# Patient Record
Sex: Male | Born: 1960 | Race: Black or African American | Hispanic: No | Smoking: Current every day smoker
Health system: Southern US, Community
[De-identification: ages and names within clinical notes are randomized; demographics above are authoritative.]

## PROBLEM LIST (undated history)

## (undated) ENCOUNTER — Emergency Department (HOSPITAL_COMMUNITY): Admission: EM | Payer: Self-pay | Source: Home / Self Care

## (undated) DIAGNOSIS — L089 Local infection of the skin and subcutaneous tissue, unspecified: Secondary | ICD-10-CM

## (undated) DIAGNOSIS — W3400XA Accidental discharge from unspecified firearms or gun, initial encounter: Secondary | ICD-10-CM

## (undated) DIAGNOSIS — M1990 Unspecified osteoarthritis, unspecified site: Secondary | ICD-10-CM

## (undated) DIAGNOSIS — Y249XXA Unspecified firearm discharge, undetermined intent, initial encounter: Secondary | ICD-10-CM

## (undated) DIAGNOSIS — M25519 Pain in unspecified shoulder: Secondary | ICD-10-CM

## (undated) DIAGNOSIS — I10 Essential (primary) hypertension: Secondary | ICD-10-CM

## (undated) HISTORY — DX: Essential (primary) hypertension: I10

## (undated) HISTORY — DX: Pain in unspecified shoulder: M25.519

## (undated) HISTORY — PX: COSMETIC SURGERY: SHX468

---

## 1997-10-30 ENCOUNTER — Emergency Department (HOSPITAL_COMMUNITY): Admission: EM | Admit: 1997-10-30 | Discharge: 1997-10-30 | Payer: Self-pay | Admitting: Emergency Medicine

## 1998-03-01 ENCOUNTER — Encounter: Payer: Self-pay | Admitting: Emergency Medicine

## 1998-03-01 ENCOUNTER — Emergency Department (HOSPITAL_COMMUNITY): Admission: EM | Admit: 1998-03-01 | Discharge: 1998-03-01 | Payer: Self-pay | Admitting: Emergency Medicine

## 1998-07-04 ENCOUNTER — Emergency Department (HOSPITAL_COMMUNITY): Admission: EM | Admit: 1998-07-04 | Discharge: 1998-07-04 | Payer: Self-pay | Admitting: Emergency Medicine

## 1998-07-05 ENCOUNTER — Encounter: Payer: Self-pay | Admitting: Emergency Medicine

## 1999-09-25 ENCOUNTER — Emergency Department (HOSPITAL_COMMUNITY): Admission: EM | Admit: 1999-09-25 | Discharge: 1999-09-25 | Payer: Self-pay | Admitting: Emergency Medicine

## 2003-03-12 ENCOUNTER — Emergency Department (HOSPITAL_COMMUNITY): Admission: EM | Admit: 2003-03-12 | Discharge: 2003-03-13 | Payer: Self-pay

## 2006-04-28 ENCOUNTER — Emergency Department (HOSPITAL_COMMUNITY): Admission: EM | Admit: 2006-04-28 | Discharge: 2006-04-28 | Payer: Self-pay | Admitting: Emergency Medicine

## 2006-10-02 ENCOUNTER — Emergency Department (HOSPITAL_COMMUNITY): Admission: EM | Admit: 2006-10-02 | Discharge: 2006-10-02 | Payer: Self-pay | Admitting: Emergency Medicine

## 2009-04-03 ENCOUNTER — Emergency Department (HOSPITAL_COMMUNITY): Admission: EM | Admit: 2009-04-03 | Discharge: 2009-04-03 | Payer: Self-pay | Admitting: Emergency Medicine

## 2010-03-23 ENCOUNTER — Emergency Department (HOSPITAL_COMMUNITY)
Admission: EM | Admit: 2010-03-23 | Discharge: 2010-03-23 | Payer: Self-pay | Source: Home / Self Care | Admitting: Emergency Medicine

## 2010-03-24 LAB — DIFFERENTIAL
Basophils Absolute: 0 10*3/uL (ref 0.0–0.1)
Basophils Relative: 0 % (ref 0–1)
Eosinophils Absolute: 0.2 10*3/uL (ref 0.0–0.7)
Eosinophils Relative: 1 % (ref 0–5)
Lymphocytes Relative: 16 % (ref 12–46)
Lymphs Abs: 2.4 10*3/uL (ref 0.7–4.0)
Monocytes Absolute: 1.2 10*3/uL — ABNORMAL HIGH (ref 0.1–1.0)
Monocytes Relative: 8 % (ref 3–12)
Neutro Abs: 10.9 10*3/uL — ABNORMAL HIGH (ref 1.7–7.7)
Neutrophils Relative %: 74 % (ref 43–77)

## 2010-03-24 LAB — POCT I-STAT, CHEM 8
BUN: 7 mg/dL (ref 6–23)
Calcium, Ion: 1.05 mmol/L — ABNORMAL LOW (ref 1.12–1.32)
Chloride: 105 mEq/L (ref 96–112)
Creatinine, Ser: 1 mg/dL (ref 0.4–1.5)
Glucose, Bld: 105 mg/dL — ABNORMAL HIGH (ref 70–99)
HCT: 43 % (ref 39.0–52.0)
Hemoglobin: 14.6 g/dL (ref 13.0–17.0)
Potassium: 3.7 mEq/L (ref 3.5–5.1)
Sodium: 140 mEq/L (ref 135–145)
TCO2: 27 mmol/L (ref 0–100)

## 2010-03-24 LAB — CBC
HCT: 37.7 % — ABNORMAL LOW (ref 39.0–52.0)
Hemoglobin: 13.2 g/dL (ref 13.0–17.0)
MCH: 30.3 pg (ref 26.0–34.0)
MCHC: 35 g/dL (ref 30.0–36.0)
MCV: 86.5 fL (ref 78.0–100.0)
Platelets: 249 10*3/uL (ref 150–400)
RBC: 4.36 MIL/uL (ref 4.22–5.81)
RDW: 14.2 % (ref 11.5–15.5)
WBC: 14.7 10*3/uL — ABNORMAL HIGH (ref 4.0–10.5)

## 2012-05-06 ENCOUNTER — Encounter (HOSPITAL_COMMUNITY): Payer: Self-pay

## 2012-05-06 ENCOUNTER — Emergency Department (HOSPITAL_COMMUNITY)
Admission: EM | Admit: 2012-05-06 | Discharge: 2012-05-06 | Disposition: A | Discharge status: Home / Self Care | Payer: Self-pay | Attending: Emergency Medicine | Admitting: Emergency Medicine

## 2012-05-06 ENCOUNTER — Emergency Department (HOSPITAL_COMMUNITY): Payer: Self-pay

## 2012-05-06 DIAGNOSIS — L02219 Cutaneous abscess of trunk, unspecified: Secondary | ICD-10-CM | POA: Insufficient documentation

## 2012-05-06 DIAGNOSIS — W010XXA Fall on same level from slipping, tripping and stumbling without subsequent striking against object, initial encounter: Secondary | ICD-10-CM | POA: Insufficient documentation

## 2012-05-06 DIAGNOSIS — F17200 Nicotine dependence, unspecified, uncomplicated: Secondary | ICD-10-CM | POA: Insufficient documentation

## 2012-05-06 DIAGNOSIS — Z87828 Personal history of other (healed) physical injury and trauma: Secondary | ICD-10-CM | POA: Insufficient documentation

## 2012-05-06 DIAGNOSIS — S46909A Unspecified injury of unspecified muscle, fascia and tendon at shoulder and upper arm level, unspecified arm, initial encounter: Secondary | ICD-10-CM | POA: Insufficient documentation

## 2012-05-06 DIAGNOSIS — Y9329 Activity, other involving ice and snow: Secondary | ICD-10-CM | POA: Insufficient documentation

## 2012-05-06 DIAGNOSIS — Y9289 Other specified places as the place of occurrence of the external cause: Secondary | ICD-10-CM | POA: Insufficient documentation

## 2012-05-06 HISTORY — DX: Accidental discharge from unspecified firearms or gun, initial encounter: W34.00XA

## 2012-05-06 HISTORY — DX: Unspecified firearm discharge, undetermined intent, initial encounter: Y24.9XXA

## 2012-05-06 MED ORDER — HYDROCODONE-ACETAMINOPHEN 5-325 MG PO TABS
1.0000 | ORAL_TABLET | Freq: Once | ORAL | Status: AC
  Administered 2012-05-06: 1 via ORAL
  Filled 2012-05-06: qty 1

## 2012-05-06 MED ORDER — HYDRALAZINE HCL 20 MG/ML IJ SOLN
INTRAMUSCULAR | Status: AC
  Filled 2012-05-06: qty 1

## 2012-05-06 MED ORDER — SULFAMETHOXAZOLE-TRIMETHOPRIM 800-160 MG PO TABS: 1.0000 | ORAL_TABLET | Freq: Two times a day (BID) | ORAL | Status: DC

## 2012-05-06 MED ORDER — HYDROCODONE-ACETAMINOPHEN 5-325 MG PO TABS: 1.0000 | ORAL_TABLET | ORAL | Status: DC | PRN

## 2012-05-06 NOTE — ED Provider Notes (Signed)
Medical screening examination/treatment/procedure(s) were performed by non-physician practitioner and as supervising physician I was immediately available for consultation/collaboration.  Olivia Mackie, MD 05/06/12 [phone]

## 2012-05-06 NOTE — ED Notes (Signed)
Pt. C/o abdominal abscess x1 day. Also c/o left shoulder pain after catching himself from fall in snow on Saturday.
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
## 2012-05-11 ENCOUNTER — Emergency Department (HOSPITAL_COMMUNITY)
Admission: EM | Admit: 2012-05-11 | Discharge: 2012-05-11 | Disposition: A | Discharge status: Home / Self Care | Payer: Self-pay | Attending: Emergency Medicine | Admitting: Emergency Medicine

## 2012-05-11 ENCOUNTER — Encounter (HOSPITAL_COMMUNITY): Payer: Self-pay | Admitting: Emergency Medicine

## 2012-05-11 DIAGNOSIS — Z87828 Personal history of other (healed) physical injury and trauma: Secondary | ICD-10-CM | POA: Insufficient documentation

## 2012-05-11 DIAGNOSIS — L02219 Cutaneous abscess of trunk, unspecified: Secondary | ICD-10-CM | POA: Insufficient documentation

## 2012-05-11 DIAGNOSIS — F17200 Nicotine dependence, unspecified, uncomplicated: Secondary | ICD-10-CM | POA: Insufficient documentation

## 2012-05-11 DIAGNOSIS — L02214 Cutaneous abscess of groin: Secondary | ICD-10-CM

## 2012-05-11 MED ORDER — CEPHALEXIN 500 MG PO CAPS: 500.0000 mg | ORAL_CAPSULE | Freq: Three times a day (TID) | ORAL | Status: DC

## 2012-05-11 MED ORDER — HYDROCODONE-ACETAMINOPHEN 5-325 MG PO TABS: 1.0000 | ORAL_TABLET | Freq: Four times a day (QID) | ORAL | Status: DC | PRN
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
## 2012-05-11 NOTE — ED Notes (Signed)
Pt reports abscess to lower abdomen that appeared 2/26. Pt states area was I&d'd at that time and now new area has appeared.

## 2012-05-16 NOTE — ED Provider Notes (Signed)
Medical screening examination/treatment/procedure(s) were performed by non-physician practitioner and as supervising physician I was immediately available for consultation/collaboration.   Kevin E Steinl, MD 05/16/12 0738 

## 2012-06-28 ENCOUNTER — Emergency Department (HOSPITAL_COMMUNITY): Payer: Self-pay

## 2012-06-28 ENCOUNTER — Encounter (HOSPITAL_COMMUNITY): Payer: Self-pay | Admitting: Emergency Medicine

## 2012-06-28 DIAGNOSIS — S20219A Contusion of unspecified front wall of thorax, initial encounter: Secondary | ICD-10-CM | POA: Insufficient documentation

## 2012-06-28 DIAGNOSIS — Y939 Activity, unspecified: Secondary | ICD-10-CM | POA: Insufficient documentation

## 2012-06-28 DIAGNOSIS — W1809XA Striking against other object with subsequent fall, initial encounter: Secondary | ICD-10-CM | POA: Insufficient documentation

## 2012-06-28 DIAGNOSIS — L0231 Cutaneous abscess of buttock: Secondary | ICD-10-CM | POA: Insufficient documentation

## 2012-06-28 DIAGNOSIS — Y929 Unspecified place or not applicable: Secondary | ICD-10-CM | POA: Insufficient documentation

## 2012-06-28 DIAGNOSIS — R9431 Abnormal electrocardiogram [ECG] [EKG]: Secondary | ICD-10-CM | POA: Insufficient documentation

## 2012-06-28 DIAGNOSIS — F17200 Nicotine dependence, unspecified, uncomplicated: Secondary | ICD-10-CM | POA: Insufficient documentation

## 2012-06-28 DIAGNOSIS — Z87828 Personal history of other (healed) physical injury and trauma: Secondary | ICD-10-CM | POA: Insufficient documentation

## 2012-06-28 LAB — CBC WITH DIFFERENTIAL/PLATELET
Basophils Absolute: 0.1 10*3/uL (ref 0.0–0.1)
Basophils Relative: 1 % (ref 0–1)
Eosinophils Absolute: 0.5 10*3/uL (ref 0.0–0.7)
Eosinophils Relative: 5 % (ref 0–5)
HCT: 33.2 % — ABNORMAL LOW (ref 39.0–52.0)
Hemoglobin: 12.1 g/dL — ABNORMAL LOW (ref 13.0–17.0)
Lymphocytes Relative: 40 % (ref 12–46)
Lymphs Abs: 4.3 10*3/uL — ABNORMAL HIGH (ref 0.7–4.0)
MCH: 30.3 pg (ref 26.0–34.0)
MCHC: 36.4 g/dL — ABNORMAL HIGH (ref 30.0–36.0)
MCV: 83 fL (ref 78.0–100.0)
Monocytes Absolute: 0.8 10*3/uL (ref 0.1–1.0)
Monocytes Relative: 8 % (ref 3–12)
Neutro Abs: 5.1 10*3/uL (ref 1.7–7.7)
Neutrophils Relative %: 47 % (ref 43–77)
Platelets: 157 10*3/uL (ref 150–400)
RBC: 4 MIL/uL — ABNORMAL LOW (ref 4.22–5.81)
RDW: 14.6 % (ref 11.5–15.5)
WBC: 10.8 10*3/uL — ABNORMAL HIGH (ref 4.0–10.5)

## 2012-06-28 NOTE — ED Notes (Signed)
PT. REPORTS SYNCOPAL EPISODE AND FELL YESTERDAY HIT HIS LEFT SIDE AGAINST TABLE , PRESENTS WITH LEFT LATERAL RIBCAGE PAIN , NO LOC /AMBULATORY , RESPIRATIONS UNLABORED . PAIN WORSE WITH PALPATION / CERTAIN POSITIONS .

## 2012-06-29 ENCOUNTER — Emergency Department (HOSPITAL_COMMUNITY)
Admission: EM | Admit: 2012-06-29 | Discharge: 2012-06-29 | Disposition: A | Discharge status: Home / Self Care | Payer: Self-pay | Attending: Emergency Medicine | Admitting: Emergency Medicine

## 2012-06-29 DIAGNOSIS — R9431 Abnormal electrocardiogram [ECG] [EKG]: Secondary | ICD-10-CM

## 2012-06-29 DIAGNOSIS — L0291 Cutaneous abscess, unspecified: Secondary | ICD-10-CM

## 2012-06-29 DIAGNOSIS — S20212A Contusion of left front wall of thorax, initial encounter: Secondary | ICD-10-CM

## 2012-06-29 LAB — COMPREHENSIVE METABOLIC PANEL
ALT: 9 U/L (ref 0–53)
AST: 21 U/L (ref 0–37)
Albumin: 3.7 g/dL (ref 3.5–5.2)
Alkaline Phosphatase: 69 U/L (ref 39–117)
BUN: 11 mg/dL (ref 6–23)
CO2: 26 mEq/L (ref 19–32)
Calcium: 9 mg/dL (ref 8.4–10.5)
Chloride: 101 mEq/L (ref 96–112)
Creatinine, Ser: 1.57 mg/dL — ABNORMAL HIGH (ref 0.50–1.35)
GFR calc Af Amer: 57 mL/min — ABNORMAL LOW (ref >90)
GFR calc non Af Amer: 49 mL/min — ABNORMAL LOW (ref >90)
Glucose, Bld: 115 mg/dL — ABNORMAL HIGH (ref 70–99)
Potassium: 3.9 mEq/L (ref 3.5–5.1)
Sodium: 135 mEq/L (ref 135–145)
Total Bilirubin: 0.2 mg/dL — ABNORMAL LOW (ref 0.3–1.2)
Total Protein: 7.3 g/dL (ref 6.0–8.3)

## 2012-06-29 LAB — TROPONIN I: Troponin I: <0.3 ng/mL (ref <0.30)

## 2012-06-29 MED ORDER — OXYCODONE-ACETAMINOPHEN 5-325 MG PO TABS: 2.0000 | ORAL_TABLET | ORAL | Status: DC | PRN

## 2012-06-29 MED ORDER — SULFAMETHOXAZOLE-TRIMETHOPRIM 800-160 MG PO TABS: 1.0000 | ORAL_TABLET | Freq: Two times a day (BID) | ORAL | Status: DC
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
## 2012-09-09 ENCOUNTER — Emergency Department (HOSPITAL_COMMUNITY)
Admission: EM | Admit: 2012-09-09 | Discharge: 2012-09-09 | Disposition: A | Discharge status: Home / Self Care | Payer: Self-pay | Attending: Emergency Medicine | Admitting: Emergency Medicine

## 2012-09-09 ENCOUNTER — Encounter (HOSPITAL_COMMUNITY): Payer: Self-pay | Admitting: *Deleted

## 2012-09-09 DIAGNOSIS — F17200 Nicotine dependence, unspecified, uncomplicated: Secondary | ICD-10-CM | POA: Insufficient documentation

## 2012-09-09 DIAGNOSIS — L0231 Cutaneous abscess of buttock: Secondary | ICD-10-CM | POA: Insufficient documentation

## 2012-09-09 DIAGNOSIS — Z87828 Personal history of other (healed) physical injury and trauma: Secondary | ICD-10-CM | POA: Insufficient documentation

## 2012-09-09 MED ORDER — LORAZEPAM 1 MG PO TABS
1.0000 mg | ORAL_TABLET | Freq: Once | ORAL | Status: AC
  Administered 2012-09-09: 1 mg via ORAL
  Filled 2012-09-09: qty 1

## 2012-09-09 MED ORDER — HYDROCODONE-ACETAMINOPHEN 5-325 MG PO TABS
1.0000 | ORAL_TABLET | Freq: Once | ORAL | Status: AC
  Administered 2012-09-09: 1 via ORAL
  Filled 2012-09-09: qty 1

## 2012-09-09 MED ORDER — TRAMADOL HCL 50 MG PO TABS: 50.0000 mg | ORAL_TABLET | Freq: Four times a day (QID) | ORAL | Status: DC | PRN

## 2012-09-09 NOTE — ED Notes (Signed)
Patient has abscess between his buttocks. States gets these freq. Under his arm , behind his knee.
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
## 2012-09-09 NOTE — ED Notes (Signed)
To ED with C/O an Abscess on his buttock.  Onset was two days ago. RN noted a large abscess on his right, mid gluteal fold. Area is very painful to touch. An area of approximately 10 X 5 cm is tender.

## 2012-09-09 NOTE — ED Provider Notes (Signed)
Medical screening examination/treatment/procedure(s) were performed by non-physician practitioner and as supervising physician I was immediately available for consultation/collaboration.   Lavora Brisbon Y. Maxwell Martorano, MD 09/09/12 2229 

## 2012-09-13 ENCOUNTER — Telehealth (INDEPENDENT_AMBULATORY_CARE_PROVIDER_SITE_OTHER): Payer: Self-pay | Admitting: General Surgery

## 2012-09-13 ENCOUNTER — Encounter (INDEPENDENT_AMBULATORY_CARE_PROVIDER_SITE_OTHER): Payer: Self-pay | Admitting: Surgery

## 2012-09-13 NOTE — Telephone Encounter (Signed)
Patient called stating he has multiple abscesses on his buttock, groin and axilla. I offered appt for evaluation in urgent office this pm. He declined appt and stated he could not leave work and could only be seen after 5:00 pm. Patient made aware we do not stay open that late. He was instructed to go to the ER for evaluation if he could not be seen by [place]. He states he will.

## 2012-09-18 ENCOUNTER — Emergency Department (HOSPITAL_COMMUNITY): Payer: Self-pay

## 2012-09-18 ENCOUNTER — Encounter (HOSPITAL_COMMUNITY): Payer: Self-pay | Admitting: Anesthesiology

## 2012-09-18 ENCOUNTER — Emergency Department (HOSPITAL_COMMUNITY): Payer: Self-pay | Admitting: Anesthesiology

## 2012-09-18 ENCOUNTER — Encounter (HOSPITAL_COMMUNITY): Payer: Self-pay | Admitting: *Deleted

## 2012-09-18 ENCOUNTER — Observation Stay (HOSPITAL_COMMUNITY)
Admission: EM | Admit: 2012-09-18 | Discharge: 2012-09-18 | Discharge status: Against Medical Advice | Payer: MEDICAID | Attending: General Surgery | Admitting: General Surgery

## 2012-09-18 ENCOUNTER — Encounter (HOSPITAL_COMMUNITY)
Admission: EM | Discharge status: Against Medical Advice | Payer: Self-pay | Source: Home / Self Care | Attending: Emergency Medicine

## 2012-09-18 DIAGNOSIS — L03319 Cellulitis of trunk, unspecified: Secondary | ICD-10-CM | POA: Insufficient documentation

## 2012-09-18 DIAGNOSIS — L0291 Cutaneous abscess, unspecified: Secondary | ICD-10-CM

## 2012-09-18 DIAGNOSIS — L02215 Cutaneous abscess of perineum: Secondary | ICD-10-CM

## 2012-09-18 DIAGNOSIS — L02219 Cutaneous abscess of trunk, unspecified: Principal | ICD-10-CM | POA: Insufficient documentation

## 2012-09-18 DIAGNOSIS — IMO0002 Reserved for concepts with insufficient information to code with codable children: Secondary | ICD-10-CM

## 2012-09-18 HISTORY — PX: IRRIGATION AND DEBRIDEMENT ABSCESS: SHX5252

## 2012-09-18 LAB — CBC WITH DIFFERENTIAL/PLATELET
Basophils Absolute: 0.1 10*3/uL (ref 0.0–0.1)
Basophils Relative: 1 % (ref 0–1)
Eosinophils Relative: 4 % (ref 0–5)
HCT: 36 % — ABNORMAL LOW (ref 39.0–52.0)
Hemoglobin: 12.7 g/dL — ABNORMAL LOW (ref 13.0–17.0)
Lymphocytes Relative: 28 % (ref 12–46)
MCHC: 35.3 g/dL (ref 30.0–36.0)
MCV: 84.5 fL (ref 78.0–100.0)
Monocytes Absolute: 1.1 10*3/uL — ABNORMAL HIGH (ref 0.1–1.0)
Monocytes Relative: 10 % (ref 3–12)
Neutro Abs: 6.4 10*3/uL (ref 1.7–7.7)
RDW: 14.5 % (ref 11.5–15.5)

## 2012-09-18 LAB — BASIC METABOLIC PANEL
BUN: 8 mg/dL (ref 6–23)
CO2: 29 mEq/L (ref 19–32)
Calcium: 8.8 mg/dL (ref 8.4–10.5)
Chloride: 102 mEq/L (ref 96–112)
Creatinine, Ser: 0.83 mg/dL (ref 0.50–1.35)

## 2012-09-18 SURGERY — IRRIGATION AND DEBRIDEMENT ABSCESS
Anesthesia: General | Site: Buttocks | Wound class: Dirty or Infected

## 2012-09-18 MED ORDER — HYDROMORPHONE HCL PF 1 MG/ML IJ SOLN
0.2500 mg | INTRAMUSCULAR | Status: DC | PRN
  Administered 2012-09-18 (×4): 0.5 mg via INTRAVENOUS

## 2012-09-18 MED ORDER — HYDROMORPHONE HCL PF 1 MG/ML IJ SOLN
1.0000 mg | Freq: Once | INTRAMUSCULAR | Status: AC
  Administered 2012-09-18: 1 mg via INTRAVENOUS
  Filled 2012-09-18: qty 1

## 2012-09-18 MED ORDER — OXYCODONE HCL 5 MG/5ML PO SOLN: 5.0000 mg | Freq: Once | ORAL | Status: DC | PRN

## 2012-09-18 MED ORDER — DOCUSATE SODIUM 100 MG PO CAPS: 100.0000 mg | ORAL_CAPSULE | Freq: Two times a day (BID) | ORAL | Status: DC

## 2012-09-18 MED ORDER — ONDANSETRON HCL 4 MG/2ML IJ SOLN: 4.0000 mg | Freq: Once | INTRAMUSCULAR | Status: DC | PRN

## 2012-09-18 MED ORDER — FENTANYL CITRATE 0.05 MG/ML IJ SOLN
INTRAMUSCULAR | Status: DC | PRN
  Administered 2012-09-18 (×2): 100 ug via INTRAVENOUS
  Administered 2012-09-18: 50 ug via INTRAVENOUS

## 2012-09-18 MED ORDER — OXYCODONE HCL 5 MG PO TABS: 5.0000 mg | ORAL_TABLET | Freq: Once | ORAL | Status: DC | PRN

## 2012-09-18 MED ORDER — OXYCODONE HCL 5 MG PO TABS
ORAL_TABLET | ORAL | Status: AC
  Filled 2012-09-18: qty 1

## 2012-09-18 MED ORDER — SUCCINYLCHOLINE CHLORIDE 20 MG/ML IJ SOLN
INTRAMUSCULAR | Status: DC | PRN
  Administered 2012-09-18: 120 mg via INTRAVENOUS

## 2012-09-18 MED ORDER — BUPIVACAINE HCL (PF) 0.25 % IJ SOLN
INTRAMUSCULAR | Status: DC | PRN
  Administered 2012-09-18: 20 mL

## 2012-09-18 MED ORDER — PIPERACILLIN-TAZOBACTAM 3.375 G IVPB 30 MIN
3.3750 g | Freq: Once | INTRAVENOUS | Status: AC
  Administered 2012-09-18: 3.375 g via INTRAVENOUS
  Filled 2012-09-18: qty 50

## 2012-09-18 MED ORDER — HYDROMORPHONE HCL PF 1 MG/ML IJ SOLN
INTRAMUSCULAR | Status: AC
  Filled 2012-09-18: qty 1

## 2012-09-18 MED ORDER — IOHEXOL 300 MG/ML  SOLN
100.0000 mL | Freq: Once | INTRAMUSCULAR | Status: AC | PRN
  Administered 2012-09-18: 100 mL via INTRAVENOUS

## 2012-09-18 MED ORDER — PIPERACILLIN-TAZOBACTAM 3.375 G IVPB
3.3750 g | Freq: Three times a day (TID) | INTRAVENOUS | Status: DC
  Administered 2012-09-18: 3.375 g via INTRAVENOUS
  Filled 2012-09-18 (×3): qty 50

## 2012-09-18 MED ORDER — IOHEXOL 300 MG/ML  SOLN
20.0000 mL | INTRAMUSCULAR | Status: AC
  Administered 2012-09-18: 25 mL via ORAL

## 2012-09-18 MED ORDER — LIDOCAINE HCL (CARDIAC) 20 MG/ML IV SOLN
INTRAVENOUS | Status: DC | PRN
  Administered 2012-09-18: 100 mg via INTRAVENOUS

## 2012-09-18 MED ORDER — 0.9 % SODIUM CHLORIDE (POUR BTL) OPTIME
TOPICAL | Status: DC | PRN
  Administered 2012-09-18: 1000 mL

## 2012-09-18 MED ORDER — OXYCODONE-ACETAMINOPHEN 5-325 MG PO TABS: 1.0000 | ORAL_TABLET | ORAL | Status: DC | PRN

## 2012-09-18 MED ORDER — BUPIVACAINE HCL (PF) 0.25 % IJ SOLN
INTRAMUSCULAR | Status: AC
  Filled 2012-09-18: qty 30

## 2012-09-18 MED ORDER — ONDANSETRON HCL 4 MG/2ML IJ SOLN: 4.0000 mg | Freq: Four times a day (QID) | INTRAMUSCULAR | Status: DC | PRN

## 2012-09-18 MED ORDER — LACTATED RINGERS IV SOLN
INTRAVENOUS | Status: DC | PRN
  Administered 2012-09-18 (×2): via INTRAVENOUS

## 2012-09-18 MED ORDER — ONDANSETRON HCL 4 MG/2ML IJ SOLN
4.0000 mg | Freq: Once | INTRAMUSCULAR | Status: AC
  Administered 2012-09-18: 4 mg via INTRAVENOUS
  Filled 2012-09-18: qty 2

## 2012-09-18 MED ORDER — MEPERIDINE HCL 25 MG/ML IJ SOLN: 6.2500 mg | INTRAMUSCULAR | Status: DC | PRN

## 2012-09-18 MED ORDER — HYDROMORPHONE HCL PF 1 MG/ML IJ SOLN: 1.0000 mg | INTRAMUSCULAR | Status: DC | PRN

## 2012-09-18 MED ORDER — PROPOFOL 10 MG/ML IV BOLUS
INTRAVENOUS | Status: DC | PRN
  Administered 2012-09-18: 20 mg via INTRAVENOUS
  Administered 2012-09-18: 100 mg via INTRAVENOUS

## 2012-09-18 SURGICAL SUPPLY — 32 items
BANDAGE GAUZE ELAST BULKY 4 IN (GAUZE/BANDAGES/DRESSINGS) IMPLANT
CANISTER SUCTION 2500CC (MISCELLANEOUS) ×2 IMPLANT
CLOTH BEACON ORANGE TIMEOUT ST (SAFETY) ×2 IMPLANT
COVER SURGICAL LIGHT HANDLE (MISCELLANEOUS) ×4 IMPLANT
DRAPE LAPAROSCOPIC ABDOMINAL (DRAPES) ×2 IMPLANT
DRAPE PED LAPAROTOMY (DRAPES) IMPLANT
DRAPE UTILITY 15X26 W/TAPE STR (DRAPE) ×8 IMPLANT
DRSG PAD ABDOMINAL 8X10 ST (GAUZE/BANDAGES/DRESSINGS) IMPLANT
ELECT CAUTERY BLADE 6.4 (BLADE) ×2 IMPLANT
ELECT REM PT RETURN 9FT ADLT (ELECTROSURGICAL) ×2
ELECTRODE REM PT RTRN 9FT ADLT (ELECTROSURGICAL) ×1 IMPLANT
GAUZE PACKING IODOFORM 1/4X5 (PACKING) ×2 IMPLANT
GLOVE BIO SURGEON STRL SZ7.5 (GLOVE) ×2 IMPLANT
GLOVE BIOGEL PI IND STRL 8 (GLOVE) ×1 IMPLANT
GLOVE BIOGEL PI INDICATOR 8 (GLOVE) ×1
GLOVE ECLIPSE 6.5 STRL STRAW (GLOVE) ×6 IMPLANT
GOWN STRL NON-REIN LRG LVL3 (GOWN DISPOSABLE) ×4 IMPLANT
GOWN STRL REIN XL XLG (GOWN DISPOSABLE) ×2 IMPLANT
KIT BASIN OR (CUSTOM PROCEDURE TRAY) ×2 IMPLANT
KIT ROOM TURNOVER OR (KITS) ×2 IMPLANT
LEGGING LITHOTOMY PAIR STRL (DRAPES) ×2 IMPLANT
NEEDLE HYPO 25GX1X1/2 BEV (NEEDLE) ×4 IMPLANT
NS IRRIG 1000ML POUR BTL (IV SOLUTION) ×2 IMPLANT
PACK GENERAL/GYN (CUSTOM PROCEDURE TRAY) ×2 IMPLANT
PAD ARMBOARD 7.5X6 YLW CONV (MISCELLANEOUS) ×2 IMPLANT
SPONGE GAUZE 4X4 12PLY (GAUZE/BANDAGES/DRESSINGS) ×6 IMPLANT
SWAB COLLECTION DEVICE MRSA (MISCELLANEOUS) ×2 IMPLANT
SYR CONTROL 10ML LL (SYRINGE) ×2 IMPLANT
TAPE CLOTH SURG 4X10 WHT LF (GAUZE/BANDAGES/DRESSINGS) ×2 IMPLANT
TOWEL OR 17X24 6PK STRL BLUE (TOWEL DISPOSABLE) ×2 IMPLANT
TOWEL OR 17X26 10 PK STRL BLUE (TOWEL DISPOSABLE) ×2 IMPLANT
TUBE ANAEROBIC SPECIMEN COL (MISCELLANEOUS) ×2 IMPLANT

## 2012-09-18 NOTE — Preoperative (Signed)
Beta Blockers   Reason not to administer Beta Blockers:Pt. not on a beta blocker

## 2012-09-18 NOTE — Op Note (Signed)
Pre Operative Diagnosis:  Perineal, bilateral axillary abscesses  Post Operative Diagnosis: same  Procedure: I&D of perineal, right and left axillary abscesses  Surgeon: Dr. Axel Filler  Assistant: none  Anesthesia: GETA  EBL: 5 cc  Complications: none  Counts: reported as correct x 2  Findings:  Anaerobic and aerobic cultures were sent from the peritoneal wound. Bilateral axillary wounds had minimal purulence, these were packed with iodoform gauze. His perineum was packed as it was very shallow after the incision and drainage.  Indications for procedure:  The patient is a 52 year old male with previous history of a left we'll abscess. The patient presented this a.m. For evaluation of a perineal abscess as well as worsening bilateral axillary abscesses. The patient was consented for the operating room for incision and drainage.  Details of the procedure:The patient was taken back to the operating room. The patient was placed in lithotomy position with bilateral SCDs in place. After appropriate anitbiotics were confirmed, a time-out was confirmed and all facts were verified.  His perineal and bilaterally which were prepped and draped in the usual sterile fashion.  A 15 blade was used to incise the perineal abscess. He was already some spontaneous drainage coming from this abscess. Cultures were taken. Hemostasis was achieved using Bovie cautery. The wound is very shallow and therefore was not packed. There was infiltrated with 0.25% Sensorcaine prior to dressing of 4 x 4's and mesh panties.  His left axillary abscess/chest wall wound had one abscess that was lateral chest wall. This was incised minimal amount of drainage was expressed. This was packed with quarter inch iodoform gauze and infiltrated with 0.25% Sensorcaine.  Right chest axilla had to abscesses which were incised. There is minimal drainage coming from these 2 wounds. There infiltrated with 0.25% tetracaine. They were each  packed with iodoform Gauze..  The patient was taken to the recovery room in stable condition.

## 2012-09-18 NOTE — Transfer of Care (Signed)
Immediate Anesthesia Transfer of Care Note  Patient: Roger Pacheco  Procedure(s) Performed: Procedure(s): IRRIGATION AND DEBRIDEMENT ABSCESS; Buttocks, perineum and bilateral axilla (N/A)  Patient Location: PACU  Anesthesia Type:General  Level of Consciousness: awake  Airway & Oxygen Therapy: Patient Spontanous Breathing and Patient connected to nasal cannula oxygen  Post-op Assessment: Report given to PACU RN and Post -op Vital signs reviewed and stable  Post vital signs: Reviewed and stable  Complications: No apparent anesthesia complications

## 2012-09-18 NOTE — ED Notes (Signed)
Pt c/o multiple abscesses. 1 abscess on scrotum, 1 abscess on butt, and bilateral abscesses under armpits

## 2012-09-18 NOTE — Anesthesia Postprocedure Evaluation (Signed)
Anesthesia Post Note  Patient: LIRON EISSLER  Procedure(s) Performed: Procedure(s) (LRB): IRRIGATION AND DEBRIDEMENT ABSCESS; Buttocks, perineum and bilateral axilla (N/A)  Anesthesia type: general  Patient location: PACU  Post pain: Pain level controlled  Post assessment: Patient's Cardiovascular Status Stable  Last Vitals:  Filed Vitals:   09/18/12 1015  BP: 158/77  Pulse: 57  Temp:   Resp: 16    Post vital signs: Reviewed and stable  Level of consciousness: sedated  Complications: No apparent anesthesia complications

## 2012-09-18 NOTE — Anesthesia Preprocedure Evaluation (Addendum)
Anesthesia Evaluation  Patient identified by MRN, date of birth, ID band Patient awake    Reviewed: Allergy & Precautions, H&P , NPO status , Patient's Chart, lab work & pertinent test results  Airway Mallampati: I TM Distance: >3 FB Neck ROM: Full    Dental  (+) Dental Advisory Given   Pulmonary          Cardiovascular     Neuro/Psych    GI/Hepatic   Endo/Other    Renal/GU      Musculoskeletal   Abdominal   Peds  Hematology   Anesthesia Other Findings   Reproductive/Obstetrics                          Anesthesia Physical Anesthesia Plan  ASA: I and emergent  Anesthesia Plan: General   Post-op Pain Management:    Induction: Intravenous, Rapid sequence and Cricoid pressure planned  Airway Management Planned: Oral ETT  Additional Equipment:   Intra-op Plan:   Post-operative Plan: Extubation in OR  Informed Consent: I have reviewed the patients History and Physical, chart, labs and discussed the procedure including the risks, benefits and alternatives for the proposed anesthesia with the patient or authorized representative who has indicated his/her understanding and acceptance.     Plan Discussed with: CRNA and Surgeon  Anesthesia Plan Comments:         Anesthesia Quick Evaluation

## 2012-09-18 NOTE — Anesthesia Procedure Notes (Signed)
Procedure Name: Intubation Date/Time: 09/18/2012 8:21 AM Performed by: Alanda Amass A Pre-anesthesia Checklist: Patient identified, Timeout performed, Emergency Drugs available, Suction available and Patient being monitored Patient Re-evaluated:Patient Re-evaluated prior to inductionOxygen Delivery Method: Circle system utilized Preoxygenation: Pre-oxygenation with 100% oxygen Intubation Type: IV induction, Rapid sequence and Cricoid Pressure applied Laryngoscope Size: Mac and 3 Grade View: Grade II Tube type: Oral Tube size: 7.5 mm Number of attempts: 1 Airway Equipment and Method: Stylet Placement Confirmation: ETT inserted through vocal cords under direct vision,  breath sounds checked- equal and bilateral and positive ETCO2 Secured at: 21 cm Tube secured with: Tape Dental Injury: Teeth and Oropharynx as per pre-operative assessment

## 2012-09-18 NOTE — ED Notes (Signed)
Pt taken to OR.

## 2012-09-18 NOTE — ED Notes (Signed)
Dr. Magdalen Spatz surgeon came by to see patient
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
## 2012-09-18 NOTE — ED Notes (Signed)
Contrast completed.
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
## 2012-09-20 ENCOUNTER — Encounter (HOSPITAL_COMMUNITY): Payer: Self-pay | Admitting: General Surgery

## 2012-09-21 LAB — CULTURE, ROUTINE-ABSCESS

## 2012-09-23 LAB — ANAEROBIC CULTURE

## 2013-03-13 ENCOUNTER — Emergency Department (HOSPITAL_COMMUNITY)
Admission: EM | Admit: 2013-03-13 | Discharge: 2013-03-13 | Disposition: A | Discharge status: Home / Self Care | Payer: Self-pay | Attending: Emergency Medicine | Admitting: Emergency Medicine

## 2013-03-13 ENCOUNTER — Encounter (HOSPITAL_COMMUNITY): Payer: Self-pay | Admitting: Emergency Medicine

## 2013-03-13 DIAGNOSIS — Z87828 Personal history of other (healed) physical injury and trauma: Secondary | ICD-10-CM | POA: Insufficient documentation

## 2013-03-13 DIAGNOSIS — F17200 Nicotine dependence, unspecified, uncomplicated: Secondary | ICD-10-CM | POA: Insufficient documentation

## 2013-03-13 DIAGNOSIS — Z202 Contact with and (suspected) exposure to infections with a predominantly sexual mode of transmission: Secondary | ICD-10-CM

## 2013-03-13 DIAGNOSIS — Z2089 Contact with and (suspected) exposure to other communicable diseases: Secondary | ICD-10-CM | POA: Insufficient documentation

## 2013-03-13 MED ORDER — METRONIDAZOLE 500 MG PO TABS
2000.0000 mg | ORAL_TABLET | Freq: Once | ORAL | Status: AC
Start: 2013-03-13 — End: 2013-03-13
  Administered 2013-03-13: 2000 mg via ORAL
  Filled 2013-03-13: qty 4

## 2013-03-13 NOTE — ED Notes (Signed)
Pt c/o possible exposure to trichomonas; pt denies complaint

## 2013-03-13 NOTE — ED Notes (Signed)
Pt's girlfriend was dx'd with trichomonas. Pt has no sx.
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
## 2013-03-15 NOTE — ED Provider Notes (Signed)
Medical screening examination/treatment/procedure(s) were performed by non-physician practitioner and as supervising physician I was immediately available for consultation/collaboration.  EKG Interpretation   None         Flint MelterElliott L Janielle Mittelstadt, MD 03/15/13 1655

## 2013-04-04 ENCOUNTER — Emergency Department (HOSPITAL_COMMUNITY)
Admission: EM | Admit: 2013-04-04 | Discharge: 2013-04-05 | Disposition: A | Discharge status: Home / Self Care | Payer: Self-pay | Attending: Emergency Medicine | Admitting: Emergency Medicine

## 2013-04-04 ENCOUNTER — Emergency Department (HOSPITAL_COMMUNITY): Payer: Self-pay

## 2013-04-04 ENCOUNTER — Encounter (HOSPITAL_COMMUNITY): Payer: Self-pay | Admitting: Emergency Medicine

## 2013-04-04 DIAGNOSIS — M2550 Pain in unspecified joint: Secondary | ICD-10-CM

## 2013-04-04 DIAGNOSIS — L02412 Cutaneous abscess of left axilla: Secondary | ICD-10-CM

## 2013-04-04 DIAGNOSIS — G8929 Other chronic pain: Secondary | ICD-10-CM | POA: Insufficient documentation

## 2013-04-04 DIAGNOSIS — F17200 Nicotine dependence, unspecified, uncomplicated: Secondary | ICD-10-CM | POA: Insufficient documentation

## 2013-04-04 DIAGNOSIS — IMO0002 Reserved for concepts with insufficient information to code with codable children: Secondary | ICD-10-CM | POA: Insufficient documentation

## 2013-04-04 DIAGNOSIS — Z87828 Personal history of other (healed) physical injury and trauma: Secondary | ICD-10-CM | POA: Insufficient documentation

## 2013-04-04 DIAGNOSIS — M25519 Pain in unspecified shoulder: Secondary | ICD-10-CM | POA: Insufficient documentation

## 2013-04-04 NOTE — ED Notes (Signed)
Pt. reports chronic left shoulder joint pain for 3 months denies recent fall or injury , pt. also stated abscess at left axilla with no drainage for several days .

## 2013-04-05 ENCOUNTER — Emergency Department (HOSPITAL_COMMUNITY): Payer: Self-pay

## 2013-04-05 MED ORDER — PREDNISONE 20 MG PO TABS: ORAL_TABLET | ORAL | Status: DC

## 2013-04-05 MED ORDER — HYDROCODONE-ACETAMINOPHEN 5-325 MG PO TABS: 1.0000 | ORAL_TABLET | Freq: Four times a day (QID) | ORAL | Status: DC | PRN

## 2013-04-05 MED ORDER — HYDROCODONE-ACETAMINOPHEN 5-325 MG PO TABS
1.0000 | ORAL_TABLET | Freq: Once | ORAL | Status: AC
  Administered 2013-04-05: 1 via ORAL
  Filled 2013-04-05 (×2): qty 1

## 2013-04-05 MED ORDER — PREDNISONE 20 MG PO TABS
60.0000 mg | ORAL_TABLET | Freq: Once | ORAL | Status: AC
  Administered 2013-04-05: 60 mg via ORAL
  Filled 2013-04-05: qty 3
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
## 2013-04-05 NOTE — ED Notes (Signed)
Pt in radiology, will bring back to exam room.

## 2013-04-05 NOTE — ED Provider Notes (Signed)
Medical screening examination/treatment/procedure(s) were performed by non-physician practitioner and as supervising physician I was immediately available for consultation/collaboration.    Olivia Mackielga M Chelesea Weiand, MD 04/05/13 [phone]

## 2013-04-05 NOTE — Discharge Instructions (Signed)
Abscess Care After An abscess (also called a boil or furuncle) is an infected area that contains a collection of pus. Signs and symptoms of an abscess include pain, tenderness, redness, or hardness, or you may feel a moveable soft area under your skin. An abscess can occur anywhere in the body. The infection may spread to surrounding tissues causing cellulitis. A cut (incision) by the surgeon was made over your abscess and the pus was drained out. Gauze may have been packed into the space to provide a drain that will allow the cavity to heal from the inside outwards. The boil may be painful for 5 to 7 days. Most people with a boil do not have high fevers. Your abscess, if seen early, may not have localized, and may not have been lanced. If not, another appointment may be required for this if it does not get better on its own or with medications. HOME CARE INSTRUCTIONS   Only take over-the-counter or prescription medicines for pain, discomfort, or fever as directed by your caregiver.  When you bathe, soak and then remove gauze or iodoform packs at least daily or as directed by your caregiver. You may then wash the wound gently with mild soapy water. Repack with gauze or do as your caregiver directs. SEEK IMMEDIATE MEDICAL CARE IF:   You develop increased pain, swelling, redness, drainage, or bleeding in the wound site.  You develop signs of generalized infection including muscle aches, chills, fever, or a general ill feeling.  An oral temperature above 102 F (38.9 C) develops, not controlled by medication. See your caregiver for a recheck if you develop any of the symptoms described above. If medications (antibiotics) were prescribed, take them as directed. Document Released: 09/11/2004 Document Revised: 05/18/2011 Document Reviewed: 05/09/2007 [facility] Patient Information 2014 [place], [place].  Arthralgia Your caregiver has diagnosed you as suffering from an arthralgia. Arthralgia means there  is pain in a joint. This can come from many reasons including:  Bruising the joint which causes soreness (inflammation) in the joint.  Wear and tear on the joints which occur as we grow older (osteoarthritis).  Overusing the joint.  Various forms of arthritis.  Infections of the joint. Regardless of the cause of pain in your joint, most of these different pains respond to anti-inflammatory drugs and rest. The exception to this is when a joint is infected, and these cases are treated with antibiotics, if it is a bacterial infection. HOME CARE INSTRUCTIONS   Rest the injured area for as long as directed by your caregiver. Then slowly start using the joint as directed by your caregiver and as the pain allows. Crutches as directed may be useful if the ankles, knees or hips are involved. If the knee was splinted or casted, continue use and care as directed. If an stretchy or elastic wrapping bandage has been applied today, it should be removed and re-applied every 3 to 4 hours. It should not be applied tightly, but firmly enough to keep swelling down. Watch toes and feet for swelling, bluish discoloration, coldness, numbness or excessive pain. If any of these problems (symptoms) occur, remove the ace bandage and re-apply more loosely. If these symptoms persist, contact your caregiver or return to this location.  For the first 24 hours, keep the injured extremity elevated on pillows while lying down.  Apply ice for 15-20 minutes to the sore joint every couple hours while awake for the first half day. Then 03-04 times per day for the first 48 hours.  Put the ice in a plastic bag and place a towel between the bag of ice and your skin.  Wear any splinting, casting, elastic bandage applications, or slings as instructed.  Only take over-the-counter or prescription medicines for pain, discomfort, or fever as directed by your caregiver. Do not use aspirin immediately after the injury unless instructed by  your physician. Aspirin can cause increased bleeding and bruising of the tissues.  If you were given crutches, continue to use them as instructed and do not resume weight bearing on the sore joint until instructed. Persistent pain and inability to use the sore joint as directed for more than 2 to 3 days are warning signs indicating that you should see a caregiver for a follow-up visit as soon as possible. Initially, a hairline fracture (break in bone) may not be evident on X-rays. Persistent pain and swelling indicate that further evaluation, non-weight bearing or use of the joint (use of crutches or slings as instructed), or further X-rays are indicated. X-rays may sometimes not show a small fracture until a week or 10 days later. Make a follow-up appointment with your own caregiver or one to whom we have referred you. A radiologist (specialist in reading X-rays) may read your X-rays. Make sure you know how you are to obtain your X-ray results. Do not assume everything is normal if you do not hear from [place]. SEEK MEDICAL CARE IF: Bruising, swelling, or pain increases. SEEK IMMEDIATE MEDICAL CARE IF:   Your fingers or toes are numb or blue.  The pain is not responding to medications and continues to stay the same or get worse.  The pain in your joint becomes severe.  You develop a fever over 102 F (38.9 C).  It becomes impossible to move or use the joint. MAKE SURE YOU:   Understand these instructions.  Will watch your condition.  Will get help right away if you are not doing well or get worse. Document Released: 02/23/2005 Document Revised: 05/18/2011 Document Reviewed: 10/12/2007 [facility] Patient Information 2014 [place], [place]. You have been given a prescription for steroids.  Over a 12 day taper.  This should help the arthralgias, and pain in her shoulders, your abscess, was opened and drained.  It should not need any further treatment

## 2013-04-13 ENCOUNTER — Emergency Department (HOSPITAL_COMMUNITY)
Admission: EM | Admit: 2013-04-13 | Discharge: 2013-04-13 | Discharge status: Against Medical Advice | Payer: Self-pay | Attending: Emergency Medicine | Admitting: Emergency Medicine

## 2013-04-13 ENCOUNTER — Encounter (HOSPITAL_COMMUNITY): Payer: Self-pay | Admitting: Emergency Medicine

## 2013-04-13 DIAGNOSIS — R51 Headache: Secondary | ICD-10-CM | POA: Insufficient documentation

## 2013-04-13 HISTORY — DX: Unspecified osteoarthritis, unspecified site: M19.90

## 2013-04-13 NOTE — ED Notes (Signed)
Pt states that he is unable to work any longer and that he will come back at a later time.

## 2013-04-13 NOTE — ED Notes (Signed)
Pt states wait is too long, encouraged to stay, stated he was leaving

## 2013-04-13 NOTE — ED Notes (Addendum)
Pt was seen here on the 27th and told his shoulder pain and headache was r/t arthritis (per pt).  Pt states the pain pills he was given helped for 1 hour only.  States continues to have occipital headaches that increases with loud noises.  Also c/o bil shoulder pain.  Pt appears to be in pain.

## 2013-04-14 ENCOUNTER — Encounter (HOSPITAL_COMMUNITY): Payer: Self-pay | Admitting: Emergency Medicine

## 2013-04-14 ENCOUNTER — Emergency Department (HOSPITAL_COMMUNITY)
Admission: EM | Admit: 2013-04-14 | Discharge: 2013-04-15 | Disposition: A | Discharge status: Home / Self Care | Payer: Self-pay | Attending: Emergency Medicine | Admitting: Emergency Medicine

## 2013-04-14 DIAGNOSIS — F17200 Nicotine dependence, unspecified, uncomplicated: Secondary | ICD-10-CM | POA: Insufficient documentation

## 2013-04-14 DIAGNOSIS — L0291 Cutaneous abscess, unspecified: Secondary | ICD-10-CM

## 2013-04-14 DIAGNOSIS — M25519 Pain in unspecified shoulder: Secondary | ICD-10-CM | POA: Insufficient documentation

## 2013-04-14 DIAGNOSIS — M25512 Pain in left shoulder: Secondary | ICD-10-CM

## 2013-04-14 DIAGNOSIS — L02818 Cutaneous abscess of other sites: Secondary | ICD-10-CM | POA: Insufficient documentation

## 2013-04-14 DIAGNOSIS — L03818 Cellulitis of other sites: Principal | ICD-10-CM

## 2013-04-14 DIAGNOSIS — Z8739 Personal history of other diseases of the musculoskeletal system and connective tissue: Secondary | ICD-10-CM | POA: Insufficient documentation

## 2013-04-14 NOTE — ED Provider Notes (Signed)
Medical screening examination/treatment/procedure(s) were performed by non-physician practitioner and as supervising physician I was immediately available for consultation/collaboration.    Olivia Mackielga M Bekim Werntz, MD 04/14/13 [phone]
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
## 2013-04-14 NOTE — ED Notes (Signed)
C/o knot on back of head and L shoulder pain x 3 weeks.  States he had a shoulder x-ray last week but has not improved any.

## 2013-04-15 MED ORDER — SULFAMETHOXAZOLE-TRIMETHOPRIM 800-160 MG PO TABS
1.0000 | ORAL_TABLET | Freq: Two times a day (BID) | ORAL | Status: DC
Start: 2013-04-15 — End: 2014-12-19

## 2013-04-15 MED ORDER — HYDROCODONE-ACETAMINOPHEN 5-325 MG PO TABS: 2.0000 | ORAL_TABLET | Freq: Four times a day (QID) | ORAL | Status: DC | PRN

## 2013-04-15 MED ORDER — CEPHALEXIN 500 MG PO CAPS: ORAL_CAPSULE | ORAL | Status: DC

## 2013-04-15 MED ORDER — HYDROCODONE-ACETAMINOPHEN 5-325 MG PO TABS
2.0000 | ORAL_TABLET | Freq: Once | ORAL | Status: AC
  Administered 2013-04-15: 2 via ORAL
  Filled 2013-04-15: qty 2

## 2013-04-15 NOTE — Discharge Instructions (Signed)
Abscess An abscess (boil or furuncle) is an infected area on or under the skin. This area is filled with yellowish-white fluid (pus) and other material (debris). HOME CARE   Only take medicines as told by your doctor.  If you were given antibiotic medicine, take it as directed. Finish the medicine even if you start to feel better.  If gauze is used, follow your doctor's directions for changing the gauze.  To avoid spreading the infection:  Keep your abscess covered with a bandage.  Wash your hands well.  Do not share personal care items, towels, or whirlpools with others.  Avoid skin contact with others.  Keep your skin and clothes clean around the abscess.  Keep all doctor visits as told. GET HELP RIGHT AWAY IF:   You have more pain, puffiness (swelling), or redness in the wound site.  You have more fluid or blood coming from the wound site.  You have muscle aches, chills, or you feel sick.  You have a fever. MAKE SURE YOU:   Understand these instructions.  Will watch your condition.  Will get help right away if you are not doing well or get worse. Document Released: 08/12/2007 Document Revised: 08/25/2011 Document Reviewed: 05/08/2011 [facility] Patient Information 2014 [place], [place].  Abscess Care After An abscess (also called a boil or furuncle) is an infected area that contains a collection of pus. Signs and symptoms of an abscess include pain, tenderness, redness, or hardness, or you may feel a moveable soft area under your skin. An abscess can occur anywhere in the body. The infection may spread to surrounding tissues causing cellulitis. A cut (incision) by the surgeon was made over your abscess and the pus was drained out. Gauze may have been packed into the space to provide a drain that will allow the cavity to heal from the inside outwards. The boil may be painful for 5 to 7 days. Most people with a boil do not have high fevers. Your abscess, if seen early, may  not have localized, and may not have been lanced. If not, another appointment may be required for this if it does not get better on its own or with medications. HOME CARE INSTRUCTIONS   Only take over-the-counter or prescription medicines for pain, discomfort, or fever as directed by your caregiver.  When you bathe, soak and then remove gauze or iodoform packs at least daily or as directed by your caregiver. You may then wash the wound gently with mild soapy water. Repack with gauze or do as your caregiver directs. SEEK IMMEDIATE MEDICAL CARE IF:   You develop increased pain, swelling, redness, drainage, or bleeding in the wound site.  You develop signs of generalized infection including muscle aches, chills, fever, or a general ill feeling.  An oral temperature above 102 F (38.9 C) develops, not controlled by medication. See your caregiver for a recheck if you develop any of the symptoms described above. If medications (antibiotics) were prescribed, take them as directed. Document Released: 09/11/2004 Document Revised: 05/18/2011 Document Reviewed: 05/09/2007 [facility] Patient Information 2014 [place], [place].  Adhesive Capsulitis Sometimes the shoulder becomes stiff and is painful to move. Some people say it feels as if the shoulder is frozen in place. Because of this, the condition is called "frozen shoulder." Its medical name is adhesive capsulitis.  The shoulder joint is made up of strong connective tissue that attaches the ball of the humerus to the shallow shoulder socket. This strong connective tissue is called the joint capsule.  This tissue can become stiff and swollen. That is when adhesive capsulitis sets in. CAUSES  It is not always clear just what the cause adhesive capsulitis. Possibilities include:  Injury to the shoulder joint.  Strain. This is a repetitive injury brought about by overuse.  Lack of use. Perhaps your arm or hand was otherwise injured. It might have been in  a sling for awhile. Or perhaps you were not using it to avoid pain.  Referred pain. This is a sort of trick the body plays. You feel pain in the shoulder. But, the pain actually comes from an injury somewhere else in the body.  Long-standing health problems. Several diseases can cause adhesive capsulitis. They include diabetes, heart disease, stroke, thyroid problems, rheumatoid arthritis and lung disease.  Being a women older than 40. Anyone can develop adhesive capsulitis but it is most common in women in this age group. SYMPTOMS   Pain.  It occurs when the arm is moved.  Parts of the shoulder might hurt if they are touched.  Pain is worse at night or when resting.  Soreness. It might not be strong enough to be called pain. But, the shoulder aches.  The shoulder does not move freely.  Muscle spasms.  Trouble sleeping because of shoulder ache or pain. DIAGNOSIS  To decide if you have adhesive capsulitis, your healthcare provider will probably:  Ask about symptoms you have noticed.  Ask about your history of joint pain and anything that might have caused the pain.  Ask about your overall health.  Use hands to feel your shoulder and neck.  Ask you to move your shoulder in specific directions. This may indicate the origin of the pain.  Order imaging tests; pictures of the shoulder. They help pinpoint the source of the problem. An X-ray might be used. For more detail, an MRI is often used. An MRI details the tendons, muscles and ligaments as well as the joint. TREATMENT  Adhesive capsulitis can be treated several ways. Most treatments can be done in a clinic or in your healthcare provider's office. Be sure to discuss the different options with your caregiver. They include:  Physical therapy. You will work on specific exercises to get your shoulder moving again. The exercises usually involve stretching. A physical therapist (a caregiver with special training) can show you what to  do and what not to do. The exercises will need to be done daily.  Medication.  Over-the-counter medicines may relieve pain and inflammation (the body's way of reacting to injury or infection).  Corticosteroids. These are stronger drugs to reduce pain and inflammation. They are given by injection (shots) into the shoulder joint. Frequent treatment is not recommended.  Muscle relaxants. Medication may be prescribed to ease muscle spasms.  Treatment of underlying conditions. This means treating another condition that is causing your shoulder problem. This might be a rotator cuff (tendon) problem  Shoulder manipulation. The shoulder will be moved by your healthcare provider. You would be under general anesthesia (given a drug that puts you to sleep). You would not feel anything. Sometimes the joint will be injected with salt water (saline) at high pressure to break down internal scarring in the joint capsule.  Surgery. This is rarely needed. It may be suggested in advanced cases after all other treatment has failed. PROGNOSIS  In time, most people recover from adhesive capsulitis. Sometimes, however, the pain goes away but full movement of the shoulder does not return.  HOME CARE INSTRUCTIONS  Take any pain medications recommended by your healthcare provider. Follow the directions carefully.  If you have physical therapy, follow through with the therapist's suggestions. Be sure you understand the exercises you will be doing. You should understand:  How often the exercises should be done.  How many times each exercise should be repeated.  How long they should be done.  What other activities you should do, or not do.  That you should warm up before doing any exercise. Just 5 to 10 minutes will help. Small, gentle movements should get your shoulder ready for more.  Avoid high-demand exercise that involves your shoulder such as throwing. This type of exercise can make pain  worse.  Consider using cold packs. Cold may ease swelling and pain. Ask your healthcare provider if a cold pack might help you. If so, get directions on how and when to use them. SEEK MEDICAL CARE IF:   You have any questions about your medications.  Your pain continues to increase. Document Released: 12/21/2008 Document Revised: 05/18/2011 Document Reviewed: 12/21/2008 [facility] Patient Information 2014 [place], [place].  Shoulder Pain The shoulder is the joint that connects your arm to your body. Muscles and band-like tissues that connect bones to muscles (tendons) hold the joint together. Shoulder pain is felt if an injury or medical problem affects one or more parts of the shoulder. HOME CARE   Put ice on the sore area.  Put ice in a plastic bag.  Place a towel between your skin and the bag.  Leave the ice on for 15-20 minutes, 03-04 times a day for the first 2 days.  Stop using cold packs if they do not help with the pain.  If you were given something to keep your shoulder from moving (sling, shoulder immobilizer), wear it as told. Only take it off to shower or bathe.  Move your arm as little as possible, but keep your hand moving to prevent puffiness (swelling).  Squeeze a soft ball or foam pad as much as possible to help prevent swelling.  Take medicine as told by your doctor. GET HELP RIGHT AWAY IF:   Your arm, hand, or fingers are numb or tingling.  Your arm, hand, or fingers are puffy (swollen), painful, or turn white or blue.  You have more pain.  You have progressing new pain in your arm, hand, or fingers.  Your hand or fingers get cold.  Your medicine does not help lessen your pain. MAKE SURE YOU:   Understand these instructions.  Will watch your condition.  Will get help right away if you are not doing well or get worse. Document Released: 08/12/2007 Document Revised: 11/18/2011 Document Reviewed: 09/07/2011 [facility] Patient Information 2014  [place], [place].  Shoulder Range of Motion Exercises The shoulder is the most flexible joint in the human body. Because of this it is also the most unstable joint in the body. All ages can develop shoulder problems. Early treatment of problems is necessary for a good outcome. People react to shoulder pain by decreasing the movement of the joint. After a brief period of time, the shoulder can become "frozen". This is an almost complete loss of the ability to move the damaged shoulder. Following injuries your caregivers can give you instructions on exercises to keep your range of motion (ability to move your shoulder freely), or regain it if it has been lost.  EXERCISES EXERCISES TO MAINTAIN THE MOBILITY OF YOUR SHOULDER: Codman's Exercise or Pendulum Exercise  This exercise may be performed  in a prone (face-down) lying position or standing while leaning on a chair with the opposite arm. Its purpose is to relax the muscles in your shoulder and slowly but surely increase the range of motion and to relieve pain.  Lie on your stomach close to the side edge of the bed. Let your weak arm hang over the edge of the bed. Relax your shoulder, arm and hand. Let your shoulder blade relax and drop down.  Slowly and gently swing your arm forward and back. Do not use your neck muscles; relax them. It might be easier to have someone else gently start swinging your arm.  As pain decreases, increase your swing. To start, arm swing should begin at 15 degree angles. In time and as pain lessens, move to 30-45 degree angles. Start with swinging for about 15 seconds, and work towards swinging for 3 to 5 minutes.  This exercise may also be performed in a standing/bent over position.  Stand and hold onto a sturdy chair with your good arm. Bend forward at the waist and bend your knees slightly to help protect your back. Relax your weak arm, let it hang limp. Relax your shoulder blade and let it drop.  Keep your shoulder  relaxed and use body motion to swing your arm in small circles.  Stand up tall and relax.  Repeat motion and change direction of circles.  Start with swinging for about 30 seconds, and work towards swinging for 3 to 5 minutes. STRETCHING EXERCISES:  Lift your arm out in front of you with the elbow bent at 90 degrees. Using your other arm gently pull the elbow forward and across your body.  Bend one arm behind you with the palm facing outward. Using the other arm, hold a towel or rope and reach this arm up above your head, then bend it at the elbow to move your wrist to behind your neck. Grab the free end of the towel with the hand behind your back. Gently pull the towel up with the hand behind your neck, gradually increasing the pull on the hand behind the small of your back. Then, gradually pull down with the hand behind the small of your back. This will pull the hand and arm behind your neck further. Both shoulders will have an increased range of motion with repetition of this exercise. STRENGTHENING EXERCISES:  Standing with your arm at your side and straight out from your shoulder with the elbow bent at 90 degrees, hold onto a small weight and slowly raise your hand so it points straight up in the air. Repeat this five times to begin with, and gradually increase to ten times. Do this four times per day. As you grow stronger you can gradually increase the weight.  Repeat the above exercise, only this time using an elastic band. Start with your hand up in the air and pull down until your hand is by your side. As you grow stronger, gradually increase the amount you pull by increasing the number or size of the elastic bands. Use the same amount of repetitions.  Standing with your hand at your side and holding onto a weight, gradually lift the hand in front of you until it is over your head. Do the same also with the hand remaining at your side and lift the hand away from your body until it is again  over your head. Repeat this five times to begin with, and gradually increase to ten times. Do this four  times per day. As you grow stronger you can gradually increase the weight. Document Released: 11/22/2002 Document Revised: 05/18/2011 Document Reviewed: 02/23/2005 [facility] Patient Information 2014 [place], [place].   Emergency Department Resource Guide 1) Find a Doctor and Pay Out of Pocket Although you won't have to find out who is covered by your insurance plan, it is a good idea to ask around and get recommendations. You will then need to call the office and see if the doctor you have chosen will accept you as a new patient and what types of options they offer for patients who are self-pay. Some doctors offer discounts or will set up payment plans for their patients who do not have insurance, but you will need to ask so you aren't surprised when you get to your appointment.  2) Contact Your Local Health Department Not all health departments have doctors that can see patients for sick visits, but many do, so it is worth a call to see if yours does. If you don't know where your local health department is, you can check in your phone book. The CDC also has a tool to help you locate your state's health department, and many state websites also have listings of all of their local health departments.  3) Find a Walk-in Clinic If your illness is not likely to be very severe or complicated, you may want to try a walk in clinic. These are popping up all over the country in pharmacies, drugstores, and shopping centers. They're usually staffed by nurse practitioners or physician assistants that have been trained to treat common illnesses and complaints. They're usually fairly quick and inexpensive. However, if you have serious medical issues or chronic medical problems, these are probably not your best option.  No Primary Care Doctor: - Call Health Connect at  [phone] - they can help you locate a primary  care doctor that  accepts your insurance, provides certain services, etc. - Physician Referral Service- [phone]  Chronic Pain Problems: Organization         Address  Phone   Notes  [facility]  [phone] Patients need to be referred by their primary care doctor.   Medication Assistance: Organization         Address  Phone   Notes  [facility] Medication [facility] [address] [place] [place], [place] [address] [phone] --Must be a resident of [facility] -- Must have NO insurance coverage whatsoever (no Medicaid/ Medicare, etc.) -- The pt. MUST have a primary care doctor that directs their care regularly and follows them in the community   [facility]  [phone]   [employer]  [phone]    Agencies that provide inexpensive medical care: Organization         Address  Phone   Notes  [facility]  [phone]   [facility]    [phone]   [facility] [address] [place], [place] [address] [phone]   [facility] [address] [place] [phone]   [facility]anned Parenthood    [phone]   [facility]    [phone]   [facility] and [facility]  [address], [place] Phone:  [phone], Fax:  [phone] Hours of Operation:  9 am - 6 pm, M-F.  Also accepts Medicaid/Medicare and self-pay.  [facility]  [address]  [address], [place] Phone: [phone], Fax: [phone]. Hours of Operation:  8:30 am - 5:30 pm, M-F.  Also accepts Medicaid and self-pay.  [facility] [place] [address], [place] Phone: [phone]   [facility] [address] Natasha Bence [place], [place] [phone], Ext. [phone] Mondays & Thursdays: 7-9 AM.  First 15 patients are seen on a first come, first serve basis.    Medicaid-accepting [facility]  Providers:  Organization         Address  Phone   Notes  [facility] [address], [place] [phone] Also accepts self-pay patients.  [facility] [address] Laurell Josephs [place], [place]  [phone]   [facility] [address], [place] [phone]   [facility] [address], [place] [phone]   Renaye Rakers [address], [place]   [phone] Only accepts [place] patients after they have their name applied to their card.   Self-Pay (no insurance) in [facility]:  Organization         Address  Phone   Notes  Sickle Cell Patients, [facility] Internal Medicine [address] [place], [place] [phone]   [facility] [address] [place], [place] [phone]   [facility] [place]  [address], [place] [phone]   [facility]/Dr. Osei-Bonsu  [address], [place] or [address], [place] [phone] Phone number for both [place] and [place] locations is the same.  [facility] [address] [place] [phone]   [facility] [address] [place] or [address] [phone]   [facility] [address], [place] [phone], phone; [phone], fax Sees patients 1st and 3rd Saturday of every month.  Must not qualify for public or private insurance (i.e. Medicaid, Medicare, [place] Health Choice, Veterans' Benefits)  Household income should be no more than 200% of the poverty level The clinic cannot treat you if you are pregnant or think you are pregnant  Sexually transmitted diseases are not treated at the clinic.    Dental Care: Organization         Address  Phone  Notes  [facility] Department of [facility] [address] [place], [place] [phone] Accepts children up to age 66 who are enrolled in [place] or [place] Health Choice; pregnant women with a Medicaid card; and children who have applied for Medicaid or [place] Health Choice, but were declined, whose parents can pay a reduced fee at time of service.  [facility] Department of [facility]  [address], [place] [phone] Accepts children up to age 61 who are enrolled in [place] or [place] Health Choice; pregnant women with a Medicaid card; and children who have applied for Medicaid or [place] Health Choice, but were declined, whose parents can pay a reduced fee at time of service.  [facility]  [address] [place], [place] [phone] Patients are seen by appointment only. Walk-ins are not accepted. [facility] will see patients 24 years of age and older. Monday - Tuesday (8am-5pm) Most Wednesdays (8:30-5pm) $30 per visit, cash only  [employer] [facility] [employer]  [address], [place] [phone] Patients are seen by appointment only. Walk-ins are not accepted. [facility] will see patients 46 years of age and older. One Wednesday Evening (Monthly: Volunteer Based).  $30 per visit, cash only  [employer]  [phone] for adults; Children under age 43, call Graduate Pediatric Dentistry at [phone]. Children aged 18-14, please call [phone] to request a pediatric application.  Dental services are provided in all areas of dental care including fillings, crowns and bridges, complete and partial dentures, implants, gum treatment, root canals, and extractions. Preventive care is also provided. Treatment is provided to both adults and children. Patients are selected via a lottery and there is often a waiting list.   [facility] [address], [place]  [phone] [email or web address]   [facility]  [address] [place], [place] [phone], Ext. [phone] Second and Fourth Thursday of each month, opens at 6:30 AM; Clinic ends at 9 AM.  Patients are seen on a first-come first-served basis, and a limited number are seen during each clinic.   [facility]  [address] Ether Griffins [place], [place] [phone]   Eligibility Requirements You must have lived in [place], [place], or [place] counties for at least the last three months.   You cannot be eligible for state or federal sponsored [place], including [employer], [place], or [employer].   You generally cannot be eligible for healthcare insurance through your employer.    How to apply: Eligibility screenings are held every Tuesday and Wednesday afternoon from 1:00 pm until 4:00 pm. You do not need an appointment for the interview!  [facility] [address], [place], [place] [phone]   [facility] Health Department  [phone]   [facility] Health Department  [phone]   [facility] Health Department  [phone]    Behavioral Health Resources in the Community: Intensive Outpatient Programs Organization         Address  Phone  Notes  [facility] [address], [place], [place] [phone]   [facility] Outpatient [address], [place], [place] [phone]   [facility] [address], [place], [place]  [phone]   [facility] [address],  [place], [place] [phone] or [phone]   Substance Abuse Resources Organization         Address  Phone  Notes  [facility]  [phone]   [facility]  [phone]   [facility] [place]  [phone]   Floydene Flock  [phone]   Residential & Outpatient Substance Abuse Program  [phone]   Psychological Services Organization         Address  Phone  Notes  [facility] Behavioral Health  [phone]    [facility] Services  [phone]   [facility] [address] [place] [address], [place] [phone] or [phone]    Mobile Crisis Teams Organization         Address  Phone  Notes  [facility], Mobile Crisis Care Unit  [phone]   [facility]  [address] [place], [place] [phone]   Doristine Locks [address] [place] [phone]    Self-Help/Support Groups Organization         Address  Phone             Notes  [facility] -  variety of support groups  336- [phone] Call for more information  Narcotics Anonymous (NA), [facility] [address], [employer] [place]  2 meetings at this location   Residential Sports administrator         Address  Phone  Notes  [facility] [address],    [place]  [phone]   [facility]  [address], [place] [address], [place], [place] [phone]   [facility] [address] [place], [place] [phone] Admissions: 8am-3pm M-F  [facility] [address],    [place], [place] [phone]   [facility] [address] [place], [place], [place] [phone]   [facility] [address],  [place], [place] [phone]   [facility] - Intensive Outpatient [address], [place], [place] [phone]   [facility] ([facility] [address]  [place], [place] [phone] or [phone]   [facility] ([facility] [address], [place], [place] [phone] Accepts Medicaid  [facility] [address]  [place] [phone] Substance Abuse/Addiction Treatment   [facility] Organization         Address  Phone  Notes  [facility]  [phone]   Angie Fava, PhD [address] [place], [place]   [phone] or [phone]    [facility]   [address] [place], [place] [phone]   [facility] [address] [place], [place] [phone] Insurance/Medicaid/sponsorship through [facility] and Families [address]                                    [place], [place] [phone] Therapy/tele-psych/case  [facility] [address], [place] [phone]    Dr. Lolly Mustache  [phone]   [facility]  [employer] [facility] Dept. 1) [address] [place] 2) [address] [place] 3)  [address], [place] [phone]  [phone]   [facility] Child Abuse Hotline [phone] or [phone] (After Hours)

## 2013-04-15 NOTE — ED Provider Notes (Signed)
Medical screening examination/treatment/procedure(s) were performed by non-physician practitioner and as supervising physician I was immediately available for consultation/collaboration.  EKG Interpretation   None         David Masneri, MD 04/15/13 1111 

## 2013-06-15 ENCOUNTER — Emergency Department (HOSPITAL_COMMUNITY)
Admission: EM | Admit: 2013-06-15 | Discharge: 2013-06-15 | Discharge status: Against Medical Advice | Payer: Self-pay | Attending: Emergency Medicine | Admitting: Emergency Medicine

## 2013-06-15 ENCOUNTER — Encounter (HOSPITAL_COMMUNITY): Payer: Self-pay | Admitting: Emergency Medicine

## 2013-06-15 DIAGNOSIS — F17200 Nicotine dependence, unspecified, uncomplicated: Secondary | ICD-10-CM | POA: Insufficient documentation

## 2013-06-15 DIAGNOSIS — L089 Local infection of the skin and subcutaneous tissue, unspecified: Secondary | ICD-10-CM | POA: Insufficient documentation

## 2013-06-15 NOTE — ED Notes (Signed)
Called for patient x 3.   Patient was never in fast track room.    Called in waiting room - no answer.

## 2013-06-15 NOTE — ED Notes (Signed)
Pt arrived with boil above sacrum redness and swelling noted.

## 2013-06-15 NOTE — ED Notes (Signed)
Called x 2 no answer

## 2013-06-16 ENCOUNTER — Encounter (HOSPITAL_COMMUNITY): Payer: Self-pay | Admitting: Emergency Medicine

## 2013-06-16 ENCOUNTER — Emergency Department (HOSPITAL_COMMUNITY)
Admission: EM | Admit: 2013-06-16 | Discharge: 2013-06-16 | Disposition: A | Discharge status: Home / Self Care | Payer: Self-pay | Attending: Emergency Medicine | Admitting: Emergency Medicine

## 2013-06-16 DIAGNOSIS — L02219 Cutaneous abscess of trunk, unspecified: Secondary | ICD-10-CM | POA: Insufficient documentation

## 2013-06-16 DIAGNOSIS — M129 Arthropathy, unspecified: Secondary | ICD-10-CM | POA: Insufficient documentation

## 2013-06-16 DIAGNOSIS — L02212 Cutaneous abscess of back [any part, except buttock]: Secondary | ICD-10-CM

## 2013-06-16 DIAGNOSIS — F17200 Nicotine dependence, unspecified, uncomplicated: Secondary | ICD-10-CM | POA: Insufficient documentation

## 2013-06-16 DIAGNOSIS — L03319 Cellulitis of trunk, unspecified: Principal | ICD-10-CM

## 2013-06-16 NOTE — ED Provider Notes (Signed)
Medical screening examination/treatment/procedure(s) were performed by non-physician practitioner and as supervising physician I was immediately available for consultation/collaboration.   EKG Interpretation None        Gwyneth SproutWhitney Tramar Brueckner, MD 06/16/13 1520

## 2013-06-16 NOTE — ED Notes (Signed)
C/o "knot" on lower back. States this is his "13th"  Abscess. "Why do I get these things"?
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
## 2013-06-16 NOTE — Discharge Instructions (Signed)
Please follow up in 2 days for a wound check either with your primary care doctor or at an [facility]. Please keep the area clean dry and covered. Please follow up with your primary care physician in 1-2 days. If you do not have one please call the [facility] number listed above. Please read all discharge instructions and return precautions.    Abscess An abscess is an infected area that contains a collection of pus and debris.It can occur in almost any part of the body. An abscess is also known as a furuncle or boil. CAUSES  An abscess occurs when tissue gets infected. This can occur from blockage of oil or sweat glands, infection of hair follicles, or a minor injury to the skin. As the body tries to fight the infection, pus collects in the area and creates pressure under the skin. This pressure causes pain. People with weakened immune systems have difficulty fighting infections and get certain abscesses more often.  SYMPTOMS Usually an abscess develops on the skin and becomes a painful mass that is red, warm, and tender. If the abscess forms under the skin, you may feel a moveable soft area under the skin. Some abscesses break open (rupture) on their own, but most will continue to get worse without care. The infection can spread deeper into the body and eventually into the bloodstream, causing you to feel ill.  DIAGNOSIS  Your caregiver will take your medical history and perform a physical exam. A sample of fluid may also be taken from the abscess to determine what is causing your infection. TREATMENT  Your caregiver may prescribe antibiotic medicines to fight the infection. However, taking antibiotics alone usually does not cure an abscess. Your caregiver may need to make a small cut (incision) in the abscess to drain the pus. In some cases, gauze is packed into the abscess to reduce pain and to continue draining the area. HOME CARE INSTRUCTIONS   Only take over-the-counter or  prescription medicines for pain, discomfort, or fever as directed by your caregiver.  If you were prescribed antibiotics, take them as directed. Finish them even if you start to feel better.  If gauze is used, follow your caregiver's directions for changing the gauze.  To avoid spreading the infection:  Keep your draining abscess covered with a bandage.  Wash your hands well.  Do not share personal care items, towels, or whirlpools with others.  Avoid skin contact with others.  Keep your skin and clothes clean around the abscess.  Keep all follow-up appointments as directed by your caregiver. SEEK MEDICAL CARE IF:   You have increased pain, swelling, redness, fluid drainage, or bleeding.  You have muscle aches, chills, or a general ill feeling.  You have a fever. MAKE SURE YOU:   Understand these instructions.  Will watch your condition.  Will get help right away if you are not doing well or get worse. Document Released: 12/03/2004 Document Revised: 08/25/2011 Document Reviewed: 05/08/2011 [facility] Patient Information 2014 [place].

## 2014-01-25 ENCOUNTER — Encounter (HOSPITAL_COMMUNITY): Payer: Self-pay | Admitting: Emergency Medicine

## 2014-01-25 DIAGNOSIS — Z720 Tobacco use: Secondary | ICD-10-CM | POA: Insufficient documentation

## 2014-01-25 DIAGNOSIS — Z87828 Personal history of other (healed) physical injury and trauma: Secondary | ICD-10-CM | POA: Insufficient documentation

## 2014-01-25 DIAGNOSIS — L0231 Cutaneous abscess of buttock: Secondary | ICD-10-CM | POA: Insufficient documentation

## 2014-01-25 DIAGNOSIS — M1990 Unspecified osteoarthritis, unspecified site: Secondary | ICD-10-CM | POA: Insufficient documentation

## 2014-01-25 NOTE — ED Notes (Signed)
Pt. reported abscess at right buttocks onset 3 days ago with no drainage .

## 2014-01-26 ENCOUNTER — Emergency Department (HOSPITAL_COMMUNITY)
Admission: EM | Admit: 2014-01-26 | Discharge: 2014-01-26 | Disposition: A | Discharge status: Home / Self Care | Payer: Self-pay | Attending: Emergency Medicine | Admitting: Emergency Medicine

## 2014-01-26 DIAGNOSIS — L0231 Cutaneous abscess of buttock: Secondary | ICD-10-CM

## 2014-01-26 MED ORDER — LIDOCAINE-EPINEPHRINE (PF) 2 %-1:200000 IJ SOLN
20.0000 mL | Freq: Once | INTRAMUSCULAR | Status: AC
  Administered 2014-01-26: 20 mL
  Filled 2014-01-26: qty 20

## 2014-01-26 MED ORDER — DOXYCYCLINE HYCLATE 100 MG PO CAPS: 100.0000 mg | ORAL_CAPSULE | Freq: Two times a day (BID) | ORAL | Status: DC
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
## 2014-01-26 NOTE — Discharge Instructions (Signed)

## 2014-08-28 ENCOUNTER — Emergency Department (HOSPITAL_COMMUNITY): Payer: Self-pay

## 2014-08-28 ENCOUNTER — Emergency Department (HOSPITAL_COMMUNITY)
Admission: EM | Admit: 2014-08-28 | Discharge: 2014-08-29 | Disposition: A | Discharge status: Home / Self Care | Payer: Worker's Compensation | Attending: Emergency Medicine | Admitting: Emergency Medicine

## 2014-08-28 ENCOUNTER — Encounter (HOSPITAL_COMMUNITY): Payer: Self-pay | Admitting: Emergency Medicine

## 2014-08-28 DIAGNOSIS — Z79899 Other long term (current) drug therapy: Secondary | ICD-10-CM | POA: Insufficient documentation

## 2014-08-28 DIAGNOSIS — S8992XA Unspecified injury of left lower leg, initial encounter: Secondary | ICD-10-CM | POA: Insufficient documentation

## 2014-08-28 DIAGNOSIS — M25562 Pain in left knee: Secondary | ICD-10-CM

## 2014-08-28 DIAGNOSIS — Y9389 Activity, other specified: Secondary | ICD-10-CM | POA: Insufficient documentation

## 2014-08-28 DIAGNOSIS — Y998 Other external cause status: Secondary | ICD-10-CM | POA: Insufficient documentation

## 2014-08-28 DIAGNOSIS — W11XXXA Fall on and from ladder, initial encounter: Secondary | ICD-10-CM | POA: Insufficient documentation

## 2014-08-28 DIAGNOSIS — Z720 Tobacco use: Secondary | ICD-10-CM | POA: Insufficient documentation

## 2014-08-28 DIAGNOSIS — T1490XA Injury, unspecified, initial encounter: Secondary | ICD-10-CM

## 2014-08-28 DIAGNOSIS — Y9289 Other specified places as the place of occurrence of the external cause: Secondary | ICD-10-CM | POA: Insufficient documentation

## 2014-08-28 DIAGNOSIS — W19XXXA Unspecified fall, initial encounter: Secondary | ICD-10-CM

## 2014-08-28 DIAGNOSIS — M1990 Unspecified osteoarthritis, unspecified site: Secondary | ICD-10-CM | POA: Insufficient documentation

## 2014-08-28 DIAGNOSIS — S199XXA Unspecified injury of neck, initial encounter: Secondary | ICD-10-CM | POA: Insufficient documentation

## 2014-08-28 DIAGNOSIS — R040 Epistaxis: Secondary | ICD-10-CM | POA: Insufficient documentation

## 2014-08-28 MED ORDER — HYDROCODONE-ACETAMINOPHEN 5-325 MG PO TABS
1.0000 | ORAL_TABLET | Freq: Once | ORAL | Status: DC
  Filled 2014-08-28: qty 1

## 2014-08-28 NOTE — ED Notes (Signed)
Pt. fell from a ladder and injured his left knee with pain this evening  , no swelling or deformity /ambulatory . Pt. also reported brief epistaxis , no bleeding at arrival.
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
## 2014-08-28 NOTE — ED Notes (Signed)
Patient transported to X-ray 

## 2014-08-28 NOTE — ED Notes (Signed)
Pt. Given ice pack for knee. Instructed use of ice pack at home. Pt. Acknowledged.

## 2014-08-29 MED ORDER — IBUPROFEN 800 MG PO TABS: 800.0000 mg | ORAL_TABLET | Freq: Three times a day (TID) | ORAL | Status: DC | PRN

## 2014-08-29 NOTE — ED Notes (Signed)
Knee immobilizer applied. Advised pt. How to apply and remove brace. Ice pack given for take home. Good pedal pulses felt before and after brace. Pt. Comfortable. Crutches given and sized.

## 2014-08-29 NOTE — Discharge Instructions (Signed)
Read the information below.  Use the prescribed medication as directed.  Please discuss all new medications with your pharmacist.  You may return to the Emergency Department at any time for worsening condition or any new symptoms that concern you.  If you develop uncontrolled pain, weakness or numbness of the extremity, severe discoloration of the skin, or you are unable to walk or move your knee, return to the ER for a recheck.    You have had a head injury which does not appear to require admission at this time. A concussion is a state of changed mental ability from trauma. SEEK IMMEDIATE MEDICAL ATTENTION IF: There is confusion or drowsiness (although children frequently become drowsy after injury).  You cannot awaken the injured person.  There is nausea (feeling sick to your stomach) or continued, forceful vomiting.  You notice dizziness or unsteadiness which is getting worse, or inability to walk.  You have convulsions or unconsciousness.  You experience severe, persistent headaches not relieved by Tylenol?. (Do not take aspirin as this impairs clotting abilities). Take other pain medications only as directed.  You cannot use arms or legs normally.  There are changes in pupil sizes. (This is the black center in the colored part of the eye)  There is clear or bloody discharge from the nose or ears.  Change in speech, vision, swallowing, or understanding.  Localized weakness, numbness, tingling, or change in bowel or bladder control.   Knee Pain The knee is the complex joint between your thigh and your lower leg. It is made up of bones, tendons, ligaments, and cartilage. The bones that make up the knee are:  The femur in the thigh.  The tibia and fibula in the lower leg.  The patella or kneecap riding in the groove on the lower femur. CAUSES  Knee pain is a common complaint with many causes. A few of these causes are:  Injury, such as:  A ruptured ligament or tendon injury.  Torn  cartilage.  Medical conditions, such as:  Gout  Arthritis  Infections  Overuse, over training, or overdoing a physical activity. Knee pain can be minor or severe. Knee pain can accompany debilitating injury. Minor knee problems often respond well to self-care measures or get well on their own. More serious injuries may need medical intervention or even surgery. SYMPTOMS The knee is complex. Symptoms of knee problems can vary widely. Some of the problems are:  Pain with movement and weight bearing.  Swelling and tenderness.  Buckling of the knee.  Inability to straighten or extend your knee.  Your knee locks and you cannot straighten it.  Warmth and redness with pain and fever.  Deformity or dislocation of the kneecap. DIAGNOSIS  Determining what is wrong may be very straight forward such as when there is an injury. It can also be challenging because of the complexity of the knee. Tests to make a diagnosis may include:  Your caregiver taking a history and doing a physical exam.  Routine X-rays can be used to rule out other problems. X-rays will not reveal a cartilage tear. Some injuries of the knee can be diagnosed by:  Arthroscopy a surgical technique by which a small video camera is inserted through tiny incisions on the sides of the knee. This procedure is used to examine and repair internal knee joint problems. Tiny instruments can be used during arthroscopy to repair the torn knee cartilage (meniscus).  Arthrography is a radiology technique. A contrast liquid is directly injected  into the knee joint. Internal structures of the knee joint then become visible on X-ray film.  An MRI scan is a non X-ray radiology procedure in which magnetic fields and a computer produce two- or three-dimensional images of the inside of the knee. Cartilage tears are often visible using an MRI scanner. MRI scans have largely replaced arthrography in diagnosing cartilage tears of the  knee.  Blood work.  Examination of the fluid that helps to lubricate the knee joint (synovial fluid). This is done by taking a sample out using a needle and a syringe. TREATMENT The treatment of knee problems depends on the cause. Some of these treatments are:  Depending on the injury, proper casting, splinting, surgery, or physical therapy care will be needed.  Give yourself adequate recovery time. Do not overuse your joints. If you begin to get sore during workout routines, back off. Slow down or do fewer repetitions.  For repetitive activities such as cycling or running, maintain your strength and nutrition.  Alternate muscle groups. For example, if you are a weight lifter, work the upper body on one day and the lower body the next.  Either tight or weak muscles do not give the proper support for your knee. Tight or weak muscles do not absorb the stress placed on the knee joint. Keep the muscles surrounding the knee strong.  Take care of mechanical problems.  If you have flat feet, orthotics or special shoes may help. See your caregiver if you need help.  Arch supports, sometimes with wedges on the inner or outer aspect of the heel, can help. These can shift pressure away from the side of the knee most bothered by osteoarthritis.  A brace called an "unloader" brace also may be used to help ease the pressure on the most arthritic side of the knee.  If your caregiver has prescribed crutches, braces, wraps or ice, use as directed. The acronym for this is PRICE. This means protection, rest, ice, compression, and elevation.  Nonsteroidal anti-inflammatory drugs (NSAIDs), can help relieve pain. But if taken immediately after an injury, they may actually increase swelling. Take NSAIDs with food in your stomach. Stop them if you develop stomach problems. Do not take these if you have a history of ulcers, stomach pain, or bleeding from the bowel. Do not take without your caregiver's approval if  you have problems with fluid retention, heart failure, or kidney problems.  For ongoing knee problems, physical therapy may be helpful.  Glucosamine and chondroitin are over-the-counter dietary supplements. Both may help relieve the pain of osteoarthritis in the knee. These medicines are different from the usual anti-inflammatory drugs. Glucosamine may decrease the rate of cartilage destruction.  Injections of a corticosteroid drug into your knee joint may help reduce the symptoms of an arthritis flare-up. They may provide pain relief that lasts a few months. You may have to wait a few months between injections. The injections do have a small increased risk of infection, water retention, and elevated blood sugar levels.  Hyaluronic acid injected into damaged joints may ease pain and provide lubrication. These injections may work by reducing inflammation. A series of shots may give relief for as long as 6 months.  Topical painkillers. Applying certain ointments to your skin may help relieve the pain and stiffness of osteoarthritis. Ask your pharmacist for suggestions. Many over the-counter products are approved for temporary relief of arthritis pain.  In some countries, doctors often prescribe topical NSAIDs for relief of chronic conditions such as  arthritis and tendinitis. A review of treatment with NSAID creams found that they worked as well as oral medications but without the serious side effects. PREVENTION  Maintain a healthy weight. Extra pounds put more strain on your joints.  Get strong, stay limber. Weak muscles are a common cause of knee injuries. Stretching is important. Include flexibility exercises in your workouts.  Be smart about exercise. If you have osteoarthritis, chronic knee pain or recurring injuries, you may need to change the way you exercise. This does not mean you have to stop being active. If your knees ache after jogging or playing basketball, consider switching to  swimming, water aerobics, or other low-impact activities, at least for a few days a week. Sometimes limiting high-impact activities will provide relief.  Make sure your shoes fit well. Choose footwear that is right for your sport.  Protect your knees. Use the proper gear for knee-sensitive activities. Use kneepads when playing volleyball or laying carpet. Buckle your seat belt every time you drive. Most shattered kneecaps occur in car accidents.  Rest when you are tired. SEEK MEDICAL CARE IF:  You have knee pain that is continual and does not seem to be getting better.  SEEK IMMEDIATE MEDICAL CARE IF:  Your knee joint feels hot to the touch and you have a high fever. MAKE SURE YOU:   Understand these instructions.  Will watch your condition.  Will get help right away if you are not doing well or get worse. Document Released: 12/21/2006 Document Revised: 05/18/2011 Document Reviewed: 12/21/2006 [facility] Patient Information 2015 [place], [place]. This information is not intended to replace advice given to you by your health care provider. Make sure you discuss any questions you have with your health care provider.  Knee Bracing Knee braces are supports to help stabilize and protect an injured or painful knee. They come in many different styles. They should support and protect the knee without increasing the chance of other injuries to yourself or others. It is important not to have a false sense of security when using a brace. Knee braces that help you to keep using your knee:  Do not restore normal knee stability under high stress forces.  May decrease some aspects of athletic performance. Some of the different types of knee braces are:  Prophylactic knee braces are designed to prevent or reduce the severity of knee injuries during sports that make injury to the knee more likely.  Rehabilitative knee braces are designed to allow protected motion of:  Injured knees.  Knees that have  been treated with or without surgery. There is no evidence that the use of a supportive knee brace protects the graft following a successful anterior cruciate ligament (ACL) reconstruction. However, braces are sometimes used to:   Protect injured ligaments.  Control knee movement during the initial healing period. They may be used as part of the treatment program for the various injured ligaments or cartilage of the knee including the:  Anterior cruciate ligament.  Medial collateral ligament.  Medial or lateral cartilage (meniscus).  Posterior cruciate ligament.  Lateral collateral ligament. Rehabilitative knee braces are most commonly used:  During crutch-assisted walking right after injury.  During crutch-assisted walking right after surgery to repair the cartilage and/or cruciate ligament injury.  For a short period of time, 2-8 weeks, after the injury or surgery. The value of a rehabilitative brace as opposed to a cast or splint includes the:  Ability to adjust the brace for swelling.  Ability to remove  the brace for examinations, icing, or showering.  Ability to allow for movement in a controlled range of motion. Functional knee braces give support to knees that have already been injured. They are designed to provide stability for the injured knee and provide protection after repair. Functional knee braces may not affect performance much. Lower extremity muscle strengthening, flexibility, and improvement in technique are more important than bracing in treating ligamentous knee injuries. Functional braces are not a substitute for rehabilitation or surgical procedures. Unloader/off-loader braces are designed to provide pain relief in arthritic knees. Patients with wear and tear arthritis from growing old or from an old cartilage injury (osteoarthritis) of the knee, and bowlegged (varus) or knock-knee (valgus) deformities, often develop increased pain in the arthritic side due to  increased loading. Unloader/off-loader braces are made to reduce uneven loading in such knees. There is reduction in bowing out movement in bowlegged knees when the correct unloader brace is used. Patients with advanced osteoarthritis or severe varus or valgus alignment problems would not likely benefit from bracing. Patellofemoral braces help the kneecap to move smoothly and well centered over the end of the femur in the knee.  Most people who wear knee braces feel that they help. However, there is a lack of scientific evidence that knee braces are helpful at the level needed for athletic participation to prevent injury. In spite of this, athletes report an increase in knee stability, pain relief, performance improvement, and confidence during athletics when using a brace.  Different knee problems require different knee braces:  Your caregiver may suggest one kind of knee brace after knee surgery.  A caregiver may choose another kind of knee brace for support instead of surgery for some types of torn ligaments.  You may also need one for pain in the front of your knee that is not getting better with strengthening and flexibility exercises. Get your caregiver's advice if you want to try a knee brace. The caregiver will advise you on where to get them and provide a prescription when it is needed to fashion and/or fit the brace. Knee braces are the least important part of preventing knee injuries or getting better following injury. Stretching, strengthening and technique improvement are far more important in caring for and preventing knee injuries. When strengthening your knee, increase your activities a little at a time so as not to develop injuries from overuse. Work out an exercise plan with your caregiver and/or physical therapist to get the best program for you. Do not let a knee brace become a crutch. Always remember, there are no braces which support the knee as well as your original ligaments and  cartilage you were born with. Conditioning, proper warm-up, and stretching remain the most important parts of keeping your knees healthy. HOW TO USE A KNEE BRACE  During sports, knee braces should be used as directed by your caregiver.  Make sure that the hinges are where the knee bends.  Straps, tapes, or hook-and-loop tapes should be fastened around your leg as instructed.  You should check the placement of the brace during activities to make sure that it has not moved. Poorly positioned braces can hurt rather than help you.  To work well, a knee brace should be worn during all activities that put you at risk of knee injury.  Warm up properly before beginning athletic activities. HOME CARE INSTRUCTIONS  Knee braces often get damaged during normal use. Replace worn-out braces for maximum benefit.  Clean regularly with soap and  water.  Inspect your brace often for wear and tear.  Cover exposed metal to protect others from injury.  Durable materials may cost more, but last longer. SEEK IMMEDIATE MEDICAL CARE IF:   Your knee seems to be getting worse rather than better.  You have increasing pain or swelling in the knee.  You have problems caused by the knee brace.  You have increased swelling or inflammation (redness or soreness) in your knee.  Your knee becomes warm and more painful and you develop an unexplained temperature over 101F (38.3C). MAKE SURE YOU:   Understand these instructions.  Will watch your condition.  Will get help right away if you are not doing well or get worse. See your caregiver, physical therapist, or orthopedic surgeon for additional information. Document Released: 05/16/2003 Document Revised: 07/10/2013 Document Reviewed: 08/22/2008 [facility] Patient Information 2015 [place], [place]. This information is not intended to replace advice given to you by your health care provider. Make sure you discuss any questions you have with your health care  provider.

## 2014-09-26 ENCOUNTER — Encounter (HOSPITAL_COMMUNITY): Payer: Self-pay | Admitting: Family Medicine

## 2014-09-26 ENCOUNTER — Emergency Department (HOSPITAL_COMMUNITY)
Admission: EM | Admit: 2014-09-26 | Discharge: 2014-09-26 | Disposition: A | Discharge status: Home / Self Care | Payer: Worker's Compensation | Attending: Emergency Medicine | Admitting: Emergency Medicine

## 2014-09-26 DIAGNOSIS — M1990 Unspecified osteoarthritis, unspecified site: Secondary | ICD-10-CM | POA: Insufficient documentation

## 2014-09-26 DIAGNOSIS — M25562 Pain in left knee: Secondary | ICD-10-CM | POA: Insufficient documentation

## 2014-09-26 DIAGNOSIS — Z720 Tobacco use: Secondary | ICD-10-CM | POA: Insufficient documentation

## 2014-09-26 DIAGNOSIS — G8911 Acute pain due to trauma: Secondary | ICD-10-CM | POA: Insufficient documentation

## 2014-09-26 MED ORDER — NAPROXEN 500 MG PO TABS: 500.0000 mg | ORAL_TABLET | Freq: Two times a day (BID) | ORAL | Status: DC

## 2014-09-26 NOTE — ED Notes (Signed)
Pt here for continued left knee pain x 1 month after fall.
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
## 2014-09-26 NOTE — Discharge Instructions (Signed)

## 2014-12-19 ENCOUNTER — Emergency Department (HOSPITAL_COMMUNITY)
Admission: EM | Admit: 2014-12-19 | Discharge: 2014-12-19 | Disposition: A | Discharge status: Home / Self Care | Payer: Self-pay | Attending: Physician Assistant | Admitting: Physician Assistant

## 2014-12-19 ENCOUNTER — Encounter (HOSPITAL_COMMUNITY): Payer: Self-pay | Admitting: Emergency Medicine

## 2014-12-19 DIAGNOSIS — Z8739 Personal history of other diseases of the musculoskeletal system and connective tissue: Secondary | ICD-10-CM | POA: Insufficient documentation

## 2014-12-19 DIAGNOSIS — Z87828 Personal history of other (healed) physical injury and trauma: Secondary | ICD-10-CM | POA: Insufficient documentation

## 2014-12-19 DIAGNOSIS — B353 Tinea pedis: Secondary | ICD-10-CM | POA: Insufficient documentation

## 2014-12-19 DIAGNOSIS — R509 Fever, unspecified: Secondary | ICD-10-CM | POA: Insufficient documentation

## 2014-12-19 DIAGNOSIS — Z720 Tobacco use: Secondary | ICD-10-CM | POA: Insufficient documentation

## 2014-12-19 DIAGNOSIS — L089 Local infection of the skin and subcutaneous tissue, unspecified: Secondary | ICD-10-CM

## 2014-12-19 DIAGNOSIS — B49 Unspecified mycosis: Secondary | ICD-10-CM

## 2014-12-19 DIAGNOSIS — Z791 Long term (current) use of non-steroidal anti-inflammatories (NSAID): Secondary | ICD-10-CM | POA: Insufficient documentation

## 2014-12-19 LAB — I-STAT CHEM 8, ED
BUN: 13 mg/dL (ref 6–20)
CHLORIDE: 105 mmol/L (ref 101–111)
Calcium, Ion: 1.09 mmol/L — ABNORMAL LOW (ref 1.12–1.23)
Creatinine, Ser: 0.9 mg/dL (ref 0.61–1.24)
GLUCOSE: 104 mg/dL — AB (ref 65–99)
HCT: 44 % (ref 39.0–52.0)
HEMOGLOBIN: 15 g/dL (ref 13.0–17.0)
POTASSIUM: 3.5 mmol/L (ref 3.5–5.1)
Sodium: 139 mmol/L (ref 135–145)
TCO2: 25 mmol/L (ref 0–100)

## 2014-12-19 MED ORDER — OXYCODONE-ACETAMINOPHEN 5-325 MG PO TABS
1.0000 | ORAL_TABLET | Freq: Once | ORAL | Status: AC
  Administered 2014-12-19: 1 via ORAL
  Filled 2014-12-19: qty 1

## 2014-12-19 MED ORDER — CIPROFLOXACIN HCL 500 MG PO TABS
500.0000 mg | ORAL_TABLET | Freq: Two times a day (BID) | ORAL | Status: DC
Start: 2014-12-19 — End: 2016-05-07

## 2014-12-19 MED ORDER — TRAMADOL HCL 50 MG PO TABS: 50.0000 mg | ORAL_TABLET | Freq: Four times a day (QID) | ORAL | Status: DC | PRN

## 2014-12-19 MED ORDER — FLUCONAZOLE 200 MG PO TABS: 200.0000 mg | ORAL_TABLET | Freq: Every day | ORAL | Status: AC

## 2014-12-19 MED ORDER — FLUCONAZOLE 100 MG PO TABS
150.0000 mg | ORAL_TABLET | Freq: Once | ORAL | Status: AC
  Administered 2014-12-19: 150 mg via ORAL
  Filled 2014-12-19: qty 2

## 2014-12-19 MED ORDER — CIPROFLOXACIN HCL 500 MG PO TABS
500.0000 mg | ORAL_TABLET | Freq: Once | ORAL | Status: AC
  Administered 2014-12-19: 500 mg via ORAL
  Filled 2014-12-19: qty 1

## 2014-12-19 NOTE — Discharge Instructions (Signed)
We are treating you for a yeast infection between your toes and possible bacterial infection. When you are home keep your feet open to the air, clean and dry.   For your hands and forearms use Eucerin cream.   Follow up with [facility].  Return here as needed.
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
## 2014-12-19 NOTE — ED Notes (Signed)
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 States since then developed rash on bilateral feet and open skin between toes. Rash bilateral forearms.

## 2015-02-04 ENCOUNTER — Emergency Department (HOSPITAL_COMMUNITY)
Admission: EM | Admit: 2015-02-04 | Discharge: 2015-02-04 | Disposition: A | Discharge status: Home / Self Care | Payer: Self-pay | Attending: Emergency Medicine | Admitting: Emergency Medicine

## 2015-02-04 ENCOUNTER — Encounter (HOSPITAL_COMMUNITY): Payer: Self-pay | Admitting: *Deleted

## 2015-02-04 DIAGNOSIS — K0889 Other specified disorders of teeth and supporting structures: Secondary | ICD-10-CM | POA: Insufficient documentation

## 2015-02-04 DIAGNOSIS — Z792 Long term (current) use of antibiotics: Secondary | ICD-10-CM | POA: Insufficient documentation

## 2015-02-04 DIAGNOSIS — Z87828 Personal history of other (healed) physical injury and trauma: Secondary | ICD-10-CM | POA: Insufficient documentation

## 2015-02-04 DIAGNOSIS — F17200 Nicotine dependence, unspecified, uncomplicated: Secondary | ICD-10-CM | POA: Insufficient documentation

## 2015-02-04 DIAGNOSIS — Z8739 Personal history of other diseases of the musculoskeletal system and connective tissue: Secondary | ICD-10-CM | POA: Insufficient documentation

## 2015-02-04 DIAGNOSIS — K029 Dental caries, unspecified: Secondary | ICD-10-CM | POA: Insufficient documentation

## 2015-02-04 MED ORDER — PENICILLIN V POTASSIUM 500 MG PO TABS: 500.0000 mg | ORAL_TABLET | Freq: Four times a day (QID) | ORAL | Status: AC

## 2015-02-04 MED ORDER — PENICILLIN V POTASSIUM 250 MG PO TABS
500.0000 mg | ORAL_TABLET | Freq: Once | ORAL | Status: AC
  Administered 2015-02-04: 500 mg via ORAL
  Filled 2015-02-04: qty 2

## 2015-02-04 MED ORDER — NAPROXEN 500 MG PO TABS: 500.0000 mg | ORAL_TABLET | Freq: Two times a day (BID) | ORAL | Status: DC

## 2015-02-04 MED ORDER — BUPIVACAINE-EPINEPHRINE (PF) 0.5% -1:200000 IJ SOLN
10.0000 mL | Freq: Once | INTRAMUSCULAR | Status: AC
  Administered 2015-02-04: 10 mL
  Filled 2015-02-04: qty 10

## 2015-02-04 MED ORDER — OXYCODONE-ACETAMINOPHEN 5-325 MG PO TABS
2.0000 | ORAL_TABLET | Freq: Once | ORAL | Status: AC
  Administered 2015-02-04: 2 via ORAL
  Filled 2015-02-04: qty 2

## 2015-02-04 NOTE — Discharge Instructions (Signed)
Dental Pain  ° ° °Dental pain may be caused by many things, including:  °Tooth decay (cavities or caries). Cavities expose the nerve of your tooth to air and hot or cold temperatures. This can cause pain or discomfort.  °Abscess or infection. A dental abscess is a collection of infected pus from a bacterial infection in the inner part of the tooth (pulp). It usually occurs at the end of the tooth's root.  °Injury.  °An unknown reason (idiopathic). °Your pain may be mild or severe. It may only occur when:  °You are chewing.  °You are exposed to hot or cold temperature.  °You are eating or drinking sugary foods or beverages, such as soda or candy. °Your pain may also be constant.  °HOME CARE INSTRUCTIONS  °Watch your dental pain for any changes. The following actions may help to lessen any discomfort that you are feeling:  °Take medicines only as directed by your dentist.  °If you were prescribed an antibiotic medicine, finish all of it even if you start to feel better.  °Keep all follow-up visits as directed by your dentist. This is important.  °Do not apply heat to the outside of your face.  °Rinse your mouth or gargle with salt water if directed by your dentist. This helps with pain and swelling.  °You can make salt water by adding ¼ tsp of salt to 1 cup of warm water. °Apply ice to the painful area of your face:  °Put ice in a plastic bag.  °Place a towel between your skin and the bag.  °Leave the ice on for 20 minutes, 2-3 times per day. °Avoid foods or drinks that cause you pain, such as:  °Very hot or very cold foods or drinks.  °Sweet or sugary foods or drinks. °SEEK MEDICAL CARE IF:  °Your pain is not controlled with medicines.  °Your symptoms are worse.  °You have new symptoms. °SEEK IMMEDIATE MEDICAL CARE IF:  °You are unable to open your mouth.  °You are having trouble breathing or swallowing.  °You have a fever.  °Your face, neck, or jaw is swollen. °This information is not intended to replace advice  given to you by your health care provider. Make sure you discuss any questions you have with your health care provider.  °Document Released: 02/23/2005 Document Revised: 07/10/2014 Document Reviewed: 02/19/2014  °Elsevier Interactive Patient Education ©2016 Elsevier Inc.  ° ° °Emergency Department Resource Guide °1) Find a Doctor and Pay Out of Pocket °Although you won't have to find out who is covered by your insurance plan, it is a good idea to ask around and get recommendations. You will then need to call the office and see if the doctor you have chosen will accept you as a new patient and what types of options they offer for patients who are self-pay. Some doctors offer discounts or will set up payment plans for their patients who do not have insurance, but you will need to ask so you aren't surprised when you get to your appointment. ° °2) Contact Your Local Health Department °Not all health departments have doctors that can see patients for sick visits, but many do, so it is worth a call to see if yours does. If you don't know where your local health department is, you can check in your phone book. The CDC also has a tool to help you locate your state's health department, and many state websites also have listings of all of their local health departments. ° °  3) Find a Walk-in Clinic °If your illness is not likely to be very severe or complicated, you may want to try a walk in clinic. These are popping up all over the country in pharmacies, drugstores, and shopping centers. They're usually staffed by nurse practitioners or physician assistants that have been trained to treat common illnesses and complaints. They're usually fairly quick and inexpensive. However, if you have serious medical issues or chronic medical problems, these are probably not your best option. ° °No Primary Care Doctor: °- Call Health Connect at  832-8000 - they can help you locate a primary care doctor that  accepts your insurance,  provides certain services, etc. °- Physician Referral Service- 1-800-533-3463 ° °Chronic Pain Problems: °Organization         Address  Phone   Notes  °[facility]  (336) 297-2271 Patients need to be referred by their primary care doctor.  ° °Medication Assistance: °Organization         Address  Phone   Notes  °[place] [facility] [address] °[place], [place] [address] (336) 641-8030 --Must be a resident of [place] °-- Must have NO insurance coverage whatsoever (no Medicaid/ Medicare, etc.) °-- The pt. MUST have a primary care doctor that directs their care regularly and follows them in the community °  °[facility]  (866) 331-1348   °[employer]  (888) 892-1162   ° °Agencies that provide inexpensive medical care: °Organization         Address  Phone   Notes  °[facility]  (336) 832-8035   °[facility]    (336) 832-7272   °[facility] [address] °[place], [place] [address] (336) 832-4777   °[facility] [address], °[place] (336) 271-4999   °[facility]    (336) 373-0678   °[facility]    (336) 272-1050   °[facility] ° [address], [place] Phone:  (336) 832-4444, Fax:  (336) 832-4440 Hours of Operation:  9 am - 6 pm, M-F.  Also accepts Medicaid/Medicare and self-pay.  °[facility] ° [address], [place] Phone: (336) 832-3150, Fax: (336) 832-3151. Hours of Operation:  8:30 am - 5:30 pm, M-F.  Also accepts Medicaid and self-pay.  °[facility] [place] [address], [place] Phone: (336) 878-6027   °[facility] [address], [place],  (336)723-1848, Ext. 123 Mondays & Thursdays: 7-9 AM.  First 15 patients are seen on a first come, first serve basis. °  ° °Medicaid-accepting [place] Providers: ° °Organization         Address  Phone    Notes  °[facility] [address], [place] (336) 641-2100 Also accepts self-pay patients.  °[facility] [address], [place] ° (336) 856-9996   °[facility] [address], [place] (336) 288-8857   °[facility] [address] [place] [address], [place] (336) 299-7000   °Veita Bland [address], [place]  ° (336) 373-1557 Only accepts [place] Access Medicaid patients after they have their name applied to their card.  ° °Self-Pay (no insurance) in [place]: ° °Organization         Address  Phone   Notes  °Sickle Cell Patients, [facility] [address], [place] (336) 832-1970   °[facility] [address], [place] (336) 832-4400   ° [facility] ° [address], [place] (336) 992-4800   °[facility]/Dr. Osei-Bonsu ° [address] [place] [address], [place] or [address], [place] (336) 841-8500 Phone number for both [place] and [place] locations is the same.  °[facility] [address], [place] (336) 299-0000   °[facility] [address] [place] [address], [place] or [address] (336) 852-7530 °(336) 878-2260   °[facility] [address], [place] (336) 350-1642, phone; (336) 294-5005, fax Sees patients 1st and 3rd Saturday of every month.  Must not qualify for public or private insurance (i.e. Medicaid, Medicare, [place] Health Choice, Veterans' Benefits) • Household income should be no more than 200% of the poverty level •The clinic cannot treat you if you are pregnant or think you are pregnant • Sexually transmitted diseases are not treated at the clinic.  ° ° °Dental Care: °Organization         Address  Phone  Notes  °[facility] [address], [place] (336)  641-6152 Accepts children up to age 21 who are enrolled in Medicaid or [place] Health Choice; pregnant women with a Medicaid card; and children who have applied for Medicaid or [place] Health Choice, but were declined, whose parents can pay a reduced fee at time of service.  °[facility] [place]  [address], [place] (336) 641-7733 Accepts children up to age 21 who are enrolled in Medicaid or [place] Health Choice; pregnant women with a Medicaid card; and children who have applied for Medicaid or [place] Health Choice, but were declined, whose parents can pay a reduced fee at time of service.  °[facility] ° [address], [place] (336) 641-4533 Patients are seen by appointment only. Walk-ins are not accepted. [facility] will see patients 18 years of age and older. °Monday - Tuesday (8am-5pm) °Most Wednesdays (8:30-5pm) °$30 per visit, cash only  °[facility] ° [address], [place] (336) 641-4533 Patients are seen by appointment only. Walk-ins are not accepted. [facility] will see patients 18 years of age and older. °One Wednesday Evening (Monthly: Volunteer Based).  $30 per visit, cash only  °[facility]  (919) 537-3737 for adults; Children under age 4, call Graduate Pediatric Dentistry at ([phone]) [phone]. Children aged 4-14, please call (919) [phone] to request a pediatric application. ° Dental services are provided in all areas of dental care including fillings, crowns and bridges, complete and partial dentures, implants, gum treatment, root canals, and extractions. Preventive care is also provided. Treatment is provided to both adults and children. °Patients are selected via a lottery and there is often a waiting list. °  °[facility] [address], °[place] ° (336) 763-8833 [email or web address] °  °[facility] [address], [place], [place] (336)723-1848, Ext.  123 Second and Fourth Thursday of each month, opens at 6:30 AM; Clinic ends at 9 AM.  Patients are seen on a first-come first-served basis, and a limited number are seen during each clinic.  ° °[facility] ° [address], [place], [place] (336) 723-7904   Eligibility Requirements °You must have lived in [place],   [place], or [place] counties for at least the last three months. °  You cannot be eligible for state or federal sponsored healthcare insurance, including Veterans Administration, Medicaid, or Medicare. °  You generally cannot be eligible for healthcare insurance through your employer.  °  How to apply: °Eligibility screenings are held every Tuesday and Wednesday afternoon from 1:00 pm until 4:00 pm. You do not need an appointment for the interview!  °[facility] [address], [place], [place] 336-631-2330   °[facility] [facility]  336-342-8273   °[facility]  336-703-3100   °[facility]  336-570-6415   ° °Behavioral Health Resources in the Community: °Intensive Outpatient Programs °Organization         Address  Phone  Notes  °[facility] [address], [place], [place] 336-878-6098   °[facility] [address], [place], [place] 336-832-9800   °[facility] [address], [place], [place] ° 336-882-2125   °[facility] [address],  °[place], [place] 1-800-853-5163 or 336-641-4981   °Substance Abuse Resources °Organization         Address  Phone  Notes  °[facility]  336-882-2125   °[facility]  336-784-9470   °[facility]  336-285-9073   °[facility]  336-845-3988   °Residential & Outpatient Substance Abuse Program  1-800-659-3381   °Psychological Services °Organization         Address  Phone  Notes  °[facility]  336- 832-9600   °[facility]  336- 378-7881   °[place] [address], [place] 1-800-853-5163 or 336-641-4981   ° °Mobile Crisis Teams °Organization         Address  Phone  Notes  °[facility], Mobile Crisis Care Unit  1-877-626-1772   °Assertive °[facility] ° [address] [place], [place] 336-834-9664   °Sharon DeEsch [address] °[place] 336-554-5454   ° °Self-Help/Support Groups °Organization         Address  Phone             Notes  °[facility] - variety of support groups  336- 373-1402 Call for more information  °Narcotics Anonymous (NA), [facility] [address], °[place]  2 meetings at this location  ° °Residential Treatment Programs °Organization         Address  Phone  Notes  °[facility] [address],    °[place]  1-866-801-8205   °[facility] ° [address], [place], [place] 704-293-8524   °[facility] [address], [place] 336-845-3988 Admissions: 8am-3pm M-F  °[facility] [address]    °[place], [place] 336-841-1104   °[facility] [address], [place], [place] 336-379-7146   °[facility] [address]  °[place], [place] 336-285-9073   °[facility] - Intensive Outpatient [address], [place], [place] 336-852-3033   °[facility] [address]  °[place], [place] 1-877-615-2722 or 336-784-9470   °[facility] [address] [place], [place] 336-227-7417 Accepts Medicaid  °[facility] [address]  °[place] 1-800-659-3381 Substance Abuse/Addiction Treatment  ° °[place] Behavioral Health Resources °Organization         Address  Phone  Notes  °[facility]  (888) 581-9988   °Julie Brannon, PhD [address] [place], [place]   (336) 349-5553 or (336) 951-0000   °[facility]   [address] °[place], [place] (336) 349-4454   °[facility] [address], [place], [place] (336) 342-8316 Insurance/Medicaid/sponsorship through [facility]  °[facility] [address]                                    [place], [place] (336) 342-8316 Therapy/tele-psych/case  °[facility] [address]  ° [place], [place] (336) 349-2233    °Dr. Arfeen  (336) 349-4544   °[facility] [place]  [employer] [place] [facility] 1) [address], [place] °2) [address], [place] °3)  [address], [place] (336) 349-3220 °(336) 342-7768 ° °(336) 342-8140   °[place] [facility] (336) 342-1394 or (336) 342-3537 (After Hours)    ° ° ° °

## 2015-02-04 NOTE — ED Notes (Signed)
The pt is c/o a toothache for 3 days
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
## 2015-02-04 NOTE — ED Notes (Signed)
C/O tooth discomfort to left lower side.

## 2015-02-06 ENCOUNTER — Telehealth: Payer: Self-pay | Admitting: *Deleted

## 2015-02-06 NOTE — Telephone Encounter (Signed)
Chart reviewed.  Spoke with pt concerning his medications and not being able to afford them.  Explained the MATCH- Medication Assistance program to pt. Pt understands that [facility] is only a one time in one year from the date of discharge to next year. Pt also understands that there is a $3.00 co pay for each prescription.  NCM will fax MATCH letter to preferred pharmacy.

## 2015-06-12 ENCOUNTER — Emergency Department (HOSPITAL_COMMUNITY)
Admission: EM | Admit: 2015-06-12 | Discharge: 2015-06-13 | Disposition: A | Discharge status: Home / Self Care | Payer: Self-pay | Attending: Emergency Medicine | Admitting: Emergency Medicine

## 2015-06-12 ENCOUNTER — Emergency Department (HOSPITAL_COMMUNITY): Payer: Self-pay

## 2015-06-12 ENCOUNTER — Encounter (HOSPITAL_COMMUNITY): Payer: Self-pay | Admitting: Adult Health

## 2015-06-12 DIAGNOSIS — L03115 Cellulitis of right lower limb: Secondary | ICD-10-CM | POA: Insufficient documentation

## 2015-06-12 DIAGNOSIS — Z791 Long term (current) use of non-steroidal anti-inflammatories (NSAID): Secondary | ICD-10-CM | POA: Insufficient documentation

## 2015-06-12 DIAGNOSIS — F17210 Nicotine dependence, cigarettes, uncomplicated: Secondary | ICD-10-CM | POA: Insufficient documentation

## 2015-06-12 DIAGNOSIS — Z792 Long term (current) use of antibiotics: Secondary | ICD-10-CM | POA: Insufficient documentation

## 2015-06-12 DIAGNOSIS — M1990 Unspecified osteoarthritis, unspecified site: Secondary | ICD-10-CM | POA: Insufficient documentation

## 2015-06-12 DIAGNOSIS — Z87828 Personal history of other (healed) physical injury and trauma: Secondary | ICD-10-CM | POA: Insufficient documentation

## 2015-06-12 HISTORY — DX: Local infection of the skin and subcutaneous tissue, unspecified: L08.9

## 2015-06-12 MED ORDER — DOXYCYCLINE HYCLATE 100 MG PO CAPS: 100.0000 mg | ORAL_CAPSULE | Freq: Two times a day (BID) | ORAL | Status: DC

## 2015-06-12 MED ORDER — DOXYCYCLINE HYCLATE 100 MG PO TABS
100.0000 mg | ORAL_TABLET | Freq: Once | ORAL | Status: AC
  Administered 2015-06-13: 100 mg via ORAL
  Filled 2015-06-12: qty 1
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
## 2015-06-12 NOTE — Discharge Instructions (Signed)

## 2015-06-12 NOTE — ED Notes (Addendum)
Presents with 3-4 days of right lower leg redness, warmth and pain pain is worse at knee, small blister noted. Pt reports that he took one penicillin he had left over and it helped the swelling and redness go down.

## 2015-06-13 ENCOUNTER — Telehealth: Payer: Self-pay | Admitting: *Deleted

## 2015-06-13 NOTE — ED Notes (Signed)
Signature pad in room not functioning.  Patient verbalized understanding of all discharge instructions and prescriptions.  All questions answered.

## 2015-06-13 NOTE — ED Provider Notes (Signed)
55 year old male signed out to me at shift change pending plain films of his knee. Patient presents with infection to his right anterior knee. Upon my evaluation patient has minor amount of redness to the anterior knee. He has no posterior or superior spreading of the cellulitis. Patient has full active pain-free range of motion of knee, he is afebrile, nontoxic in no acute distress. Patient has an area that appears to have drained, he reports this appeared to be a blister; likely the source of the infection. Patient has no signs or symptoms consistent with septic arthritis, no acute findings on plain films, localized infection. He has no systemic symptoms, he will be treated with a dose of doxycycline here, discharged home on 10 day course, primary care follow-up in 3 days. He is instructed to return to the emergency room immediately if any new or worsening signs or symptoms present. Patient verbalized his understanding and agreement to today's plan had no further questions or concerns at the time discharge.  Eyvonne MechanicJeffrey Garrett Bowring, PA-C 06/13/15 0009  Pricilla LovelessScott Goldston, MD 06/14/15 [phone]

## 2015-06-13 NOTE — Telephone Encounter (Signed)
Pt called stating Rx doxycycline (VIBRAMYCIN) 100 MG capsule being too expensive.  ERCM [facility] and set up appointment for PCP establishment.  Pt verbalized understanding and apologizes to all he has offended.

## 2015-06-28 ENCOUNTER — Ambulatory Visit: Payer: Self-pay | Admitting: Family Medicine

## 2016-05-02 ENCOUNTER — Emergency Department (HOSPITAL_COMMUNITY)
Admission: EM | Admit: 2016-05-02 | Discharge: 2016-05-02 | Disposition: A | Discharge status: Home / Self Care | Payer: Medicaid Other | Attending: Emergency Medicine | Admitting: Emergency Medicine

## 2016-05-02 ENCOUNTER — Encounter (HOSPITAL_COMMUNITY): Payer: Self-pay | Admitting: Emergency Medicine

## 2016-05-02 DIAGNOSIS — L02414 Cutaneous abscess of left upper limb: Secondary | ICD-10-CM | POA: Insufficient documentation

## 2016-05-02 DIAGNOSIS — Z7982 Long term (current) use of aspirin: Secondary | ICD-10-CM | POA: Insufficient documentation

## 2016-05-02 DIAGNOSIS — L02419 Cutaneous abscess of limb, unspecified: Secondary | ICD-10-CM

## 2016-05-02 DIAGNOSIS — F17200 Nicotine dependence, unspecified, uncomplicated: Secondary | ICD-10-CM | POA: Diagnosis not present

## 2016-05-02 MED ORDER — SULFAMETHOXAZOLE-TRIMETHOPRIM 800-160 MG PO TABS: 1.0000 | ORAL_TABLET | Freq: Two times a day (BID) | ORAL | 0 refills | Status: AC

## 2016-05-02 MED ORDER — CEPHALEXIN 500 MG PO CAPS: 500.0000 mg | ORAL_CAPSULE | Freq: Four times a day (QID) | ORAL | 0 refills | Status: DC

## 2016-05-02 NOTE — Discharge Instructions (Signed)
Encourage the abscess to drain. Watch for fevers or increased pain in the elbow.
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
## 2016-05-02 NOTE — ED Triage Notes (Signed)
Pt c/o pain and knot to left elbow noted 4 days ago. Pt also c/o right calf pain and shortness of breath for 4 days.

## 2016-05-05 ENCOUNTER — Emergency Department (HOSPITAL_COMMUNITY): Payer: Medicaid Other

## 2016-05-05 ENCOUNTER — Emergency Department (HOSPITAL_COMMUNITY)
Admission: EM | Admit: 2016-05-05 | Discharge: 2016-05-05 | Disposition: A | Discharge status: Home / Self Care | Payer: Medicaid Other | Attending: Emergency Medicine | Admitting: Emergency Medicine

## 2016-05-05 ENCOUNTER — Encounter (HOSPITAL_COMMUNITY): Payer: Self-pay | Admitting: *Deleted

## 2016-05-05 DIAGNOSIS — Z5321 Procedure and treatment not carried out due to patient leaving prior to being seen by health care provider: Secondary | ICD-10-CM | POA: Diagnosis not present

## 2016-05-05 DIAGNOSIS — R109 Unspecified abdominal pain: Secondary | ICD-10-CM | POA: Insufficient documentation

## 2016-05-05 LAB — URINALYSIS, ROUTINE W REFLEX MICROSCOPIC
Bacteria, UA: NONE SEEN
Bilirubin Urine: NEGATIVE
GLUCOSE, UA: NEGATIVE mg/dL
KETONES UR: NEGATIVE mg/dL
Leukocytes, UA: NEGATIVE
NITRITE: NEGATIVE
PH: 5 (ref 5.0–8.0)
PROTEIN: 30 mg/dL — AB
Specific Gravity, Urine: 1.017 (ref 1.005–1.030)
Squamous Epithelial / LPF: NONE SEEN

## 2016-05-05 LAB — BASIC METABOLIC PANEL
ANION GAP: 12 (ref 5–15)
BUN: 8 mg/dL (ref 6–20)
CHLORIDE: 100 mmol/L — AB (ref 101–111)
CO2: 24 mmol/L (ref 22–32)
Calcium: 9 mg/dL (ref 8.9–10.3)
Creatinine, Ser: 0.84 mg/dL (ref 0.61–1.24)
GFR calc Af Amer: >60 mL/min (ref >60)
GFR calc non Af Amer: >60 mL/min (ref >60)
Glucose, Bld: 105 mg/dL — ABNORMAL HIGH (ref 65–99)
POTASSIUM: 3.7 mmol/L (ref 3.5–5.1)
SODIUM: 136 mmol/L (ref 135–145)

## 2016-05-05 LAB — CBC
HEMATOCRIT: 34.5 % — AB (ref 39.0–52.0)
HEMOGLOBIN: 12.2 g/dL — AB (ref 13.0–17.0)
MCH: 29.5 pg (ref 26.0–34.0)
MCHC: 35.4 g/dL (ref 30.0–36.0)
MCV: 83.5 fL (ref 78.0–100.0)
PLATELETS: 304 10*3/uL (ref 150–400)
RBC: 4.13 MIL/uL — AB (ref 4.22–5.81)
RDW: 14 % (ref 11.5–15.5)
WBC: 16.2 10*3/uL — AB (ref 4.0–10.5)

## 2016-05-05 LAB — I-STAT TROPONIN, ED: Troponin i, poc: 0 ng/mL (ref 0.00–0.08)

## 2016-05-05 LAB — LIPASE, BLOOD: LIPASE: 28 U/L (ref 11–51)

## 2016-05-05 NOTE — ED Notes (Signed)
Pt requesting this EMT check his placement in line to go back to a room to see a doctor. Pt upset stating that if he didn't see a doctor he was going to leave. Pt was informed that he had 7 people ahead of him still and that out current wait time was a little over 5 hours.Pt upset stating he has been working all day and comes here just to be waiting all day and he has to be at work in the morning and if we would get a doctor out to see him.Pt informed that we currently had no open rooms and that he would have to wait a little longer still. Pt grabbed his belonging and left the emergency room waiting area. Nurse First aware.

## 2016-05-05 NOTE — ED Triage Notes (Signed)
Pt presents to ED from multiple complaints. Pt c/o chest pain, "like a hollow feeling in my chest." Also c/o abdominal pain with nausea and an abscess to L elbow. Pt was seen on 2/24 for abscess and given antibiotics, reports drainage yesterday, swelling noted to elbow

## 2016-05-07 ENCOUNTER — Encounter (HOSPITAL_COMMUNITY): Payer: Self-pay

## 2016-05-07 ENCOUNTER — Emergency Department (HOSPITAL_COMMUNITY)
Admission: EM | Admit: 2016-05-07 | Discharge: 2016-05-07 | Disposition: A | Discharge status: Home / Self Care | Payer: Medicaid Other | Attending: Emergency Medicine | Admitting: Emergency Medicine

## 2016-05-07 DIAGNOSIS — F17200 Nicotine dependence, unspecified, uncomplicated: Secondary | ICD-10-CM | POA: Diagnosis not present

## 2016-05-07 DIAGNOSIS — Z7982 Long term (current) use of aspirin: Secondary | ICD-10-CM | POA: Insufficient documentation

## 2016-05-07 DIAGNOSIS — M71022 Abscess of bursa, left elbow: Secondary | ICD-10-CM | POA: Diagnosis not present

## 2016-05-07 MED ORDER — HYDROCODONE-ACETAMINOPHEN 5-325 MG PO TABS: 2.0000 | ORAL_TABLET | ORAL | 0 refills | Status: DC | PRN

## 2016-05-07 MED ORDER — LIDOCAINE HCL (PF) 1 % IJ SOLN
INTRAMUSCULAR | Status: AC
  Administered 2016-05-07: 2.1 mL
  Filled 2016-05-07: qty 5

## 2016-05-07 MED ORDER — LIDOCAINE HCL 2 % IJ SOLN
10.0000 mL | Freq: Once | INTRAMUSCULAR | Status: DC
  Filled 2016-05-07: qty 20

## 2016-05-07 MED ORDER — AMOXICILLIN 500 MG PO CAPS: 500.0000 mg | ORAL_CAPSULE | Freq: Three times a day (TID) | ORAL | 0 refills | Status: DC

## 2016-05-07 MED ORDER — CEFTRIAXONE SODIUM 1 G IJ SOLR
1.0000 g | Freq: Once | INTRAMUSCULAR | Status: AC
  Administered 2016-05-07: 1 g via INTRAMUSCULAR
  Filled 2016-05-07: qty 10

## 2016-05-07 NOTE — Discharge Instructions (Signed)
Return in 2 days for wound recheck.

## 2016-05-07 NOTE — ED Notes (Signed)
Left elbow wrapped with dry gauze.
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
## 2016-05-07 NOTE — ED Triage Notes (Signed)
Per pT, Pt is coming from home with abscess to the left elbow. Requesting that it is cleaned and redressed. Reports being seen for the same thing in the last four days. Stopped taking antibiotics and denies fevers.

## 2016-07-24 ENCOUNTER — Emergency Department (HOSPITAL_COMMUNITY)
Admission: EM | Admit: 2016-07-24 | Discharge: 2016-07-25 | Disposition: A | Discharge status: Home / Self Care | Payer: Medicaid Other | Attending: Emergency Medicine | Admitting: Emergency Medicine

## 2016-07-24 ENCOUNTER — Emergency Department (HOSPITAL_COMMUNITY): Payer: Medicaid Other

## 2016-07-24 ENCOUNTER — Encounter (HOSPITAL_COMMUNITY): Payer: Self-pay

## 2016-07-24 DIAGNOSIS — R0781 Pleurodynia: Secondary | ICD-10-CM | POA: Diagnosis not present

## 2016-07-24 DIAGNOSIS — Y939 Activity, unspecified: Secondary | ICD-10-CM | POA: Insufficient documentation

## 2016-07-24 DIAGNOSIS — W010XXA Fall on same level from slipping, tripping and stumbling without subsequent striking against object, initial encounter: Secondary | ICD-10-CM | POA: Insufficient documentation

## 2016-07-24 DIAGNOSIS — Y929 Unspecified place or not applicable: Secondary | ICD-10-CM | POA: Diagnosis not present

## 2016-07-24 DIAGNOSIS — S299XXA Unspecified injury of thorax, initial encounter: Secondary | ICD-10-CM | POA: Diagnosis present

## 2016-07-24 DIAGNOSIS — Y999 Unspecified external cause status: Secondary | ICD-10-CM | POA: Insufficient documentation

## 2016-07-24 MED ORDER — ACETAMINOPHEN 325 MG PO TABS
325.0000 mg | ORAL_TABLET | Freq: Once | ORAL | Status: AC
  Administered 2016-07-24: 325 mg via ORAL
  Filled 2016-07-24: qty 1

## 2016-07-24 MED ORDER — IBUPROFEN 400 MG PO TABS
600.0000 mg | ORAL_TABLET | Freq: Once | ORAL | Status: AC
  Administered 2016-07-24: 21:00:00 600 mg via ORAL
  Filled 2016-07-24: qty 1

## 2016-07-24 MED ORDER — OXYCODONE-ACETAMINOPHEN 5-325 MG PO TABS
2.0000 | ORAL_TABLET | Freq: Once | ORAL | Status: AC
  Administered 2016-07-24: 2 via ORAL
  Filled 2016-07-24: qty 2
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
## 2016-07-24 NOTE — ED Triage Notes (Signed)
Pt reports he was standing on the tire of his truck, slipped and the right side of his chest/rib cage fell into the side of his truck about 6 hours PTA. He states he feels SOB and pain with inspiration and coughing. No bruising noted. Pt speaking in clear complete sentences.

## 2016-07-24 NOTE — Discharge Instructions (Signed)
Your chest x-ray and EKG were normal and reassuring today.  I suspect your rib pain is from a contusion or bruise. This will probably hurt for the next week or so but it should resolve within 2 weeks.   Please take 600 mg ibuprofen + 1000 mg tylenol every 8 hours for the next 3-5 days.  Use your breathing device to make sure that you are taking deep breaths and your lungs expand appropriately, this will prevent pneumonia.  You may place ice over the injured area, this will also help with inflammation.   Return to the emergency department if you develop worsening pain, difficulty breathing, chest pain or any other concerning symptoms.

## 2016-07-24 NOTE — Progress Notes (Signed)
Pt given IS and explanation on how to do breathing exercise. Pt was able to demonstrate how to use IS correctly. RT will continue to monitor.

## 2016-07-25 NOTE — ED Notes (Signed)
Pt verbalized understanding of d/c instructions and has no further questions. Pt is stable, A&Ox4, VSS.  

## 2016-08-16 ENCOUNTER — Emergency Department (HOSPITAL_COMMUNITY)
Admission: EM | Admit: 2016-08-16 | Discharge: 2016-08-16 | Disposition: A | Discharge status: Home / Self Care | Payer: Medicaid Other | Attending: Emergency Medicine | Admitting: Emergency Medicine

## 2016-08-16 ENCOUNTER — Encounter (HOSPITAL_COMMUNITY): Payer: Self-pay | Admitting: Emergency Medicine

## 2016-08-16 DIAGNOSIS — F17200 Nicotine dependence, unspecified, uncomplicated: Secondary | ICD-10-CM | POA: Insufficient documentation

## 2016-08-16 DIAGNOSIS — R197 Diarrhea, unspecified: Secondary | ICD-10-CM | POA: Insufficient documentation

## 2016-08-16 DIAGNOSIS — R112 Nausea with vomiting, unspecified: Secondary | ICD-10-CM | POA: Diagnosis present

## 2016-08-16 DIAGNOSIS — R1030 Lower abdominal pain, unspecified: Secondary | ICD-10-CM | POA: Diagnosis not present

## 2016-08-16 LAB — COMPREHENSIVE METABOLIC PANEL
ALBUMIN: 4.2 g/dL (ref 3.5–5.0)
ALT: 18 U/L (ref 17–63)
ANION GAP: 9 (ref 5–15)
AST: 24 U/L (ref 15–41)
Alkaline Phosphatase: 72 U/L (ref 38–126)
BUN: 12 mg/dL (ref 6–20)
CO2: 20 mmol/L — ABNORMAL LOW (ref 22–32)
Calcium: 9.1 mg/dL (ref 8.9–10.3)
Chloride: 107 mmol/L (ref 101–111)
Creatinine, Ser: 0.78 mg/dL (ref 0.61–1.24)
GFR calc Af Amer: >60 mL/min (ref >60)
GFR calc non Af Amer: >60 mL/min (ref >60)
GLUCOSE: 105 mg/dL — AB (ref 65–99)
POTASSIUM: 3.6 mmol/L (ref 3.5–5.1)
Sodium: 136 mmol/L (ref 135–145)
Total Bilirubin: 0.7 mg/dL (ref 0.3–1.2)
Total Protein: 8 g/dL (ref 6.5–8.1)

## 2016-08-16 LAB — CBC
HCT: 42.2 % (ref 39.0–52.0)
HEMOGLOBIN: 14.8 g/dL (ref 13.0–17.0)
MCH: 29.5 pg (ref 26.0–34.0)
MCHC: 35.1 g/dL (ref 30.0–36.0)
MCV: 84.1 fL (ref 78.0–100.0)
Platelets: 280 10*3/uL (ref 150–400)
RBC: 5.02 MIL/uL (ref 4.22–5.81)
RDW: 14.4 % (ref 11.5–15.5)
WBC: 7.8 10*3/uL (ref 4.0–10.5)

## 2016-08-16 LAB — LIPASE, BLOOD: LIPASE: 29 U/L (ref 11–51)

## 2016-08-16 MED ORDER — ONDANSETRON 4 MG PO TBDP: 4.0000 mg | ORAL_TABLET | Freq: Once | ORAL | Status: DC | PRN

## 2016-08-16 MED ORDER — DICYCLOMINE HCL 10 MG PO CAPS
10.0000 mg | ORAL_CAPSULE | Freq: Once | ORAL | Status: AC
  Administered 2016-08-16: 10 mg via ORAL
  Filled 2016-08-16: qty 1

## 2016-08-16 MED ORDER — ONDANSETRON 4 MG PO TBDP: 4.0000 mg | ORAL_TABLET | Freq: Three times a day (TID) | ORAL | 0 refills | Status: DC | PRN

## 2016-08-16 MED ORDER — SODIUM CHLORIDE 0.9 % IV BOLUS (SEPSIS)
1000.0000 mL | Freq: Once | INTRAVENOUS | Status: AC
  Administered 2016-08-16: 1000 mL via INTRAVENOUS

## 2016-08-16 MED ORDER — ONDANSETRON 4 MG PO TBDP
ORAL_TABLET | ORAL | Status: AC
  Administered 2016-08-16: 4 mg
  Filled 2016-08-16: qty 1

## 2016-08-16 NOTE — ED Notes (Signed)
Pt states " I ate a vienna sausage sandwich on Friday, and I havent been able to eat anything. "  Pt states he takes 6 BC powders a day. Diarrhea is brown, runny per pt.

## 2016-08-16 NOTE — ED Triage Notes (Signed)
Pt here for N/V/D since eating on Friday

## 2016-08-16 NOTE — Discharge Instructions (Signed)
Continue to hydrate orally with small sips of fluids throughout the day. Use Zofran as directed for nausea & vomiting.   The 'BRAT' diet is suggested, then progress to diet as tolerated as symptoms abate.  Bananas.  Rice.  Applesauce.  Toast (and other simple starches such as crackers, potatoes, noodles).   SEEK IMMEDIATE MEDICAL ATTENTION IF: You begin having localized abdominal pain that does not go away or becomes severe A temperature above 101 develops Repeated vomiting occurs (multiple uncontrollable episodes) or you are unable to keep fluids down Blood is being passed in stools or vomit (bright red or black tarry stools).  New or worsening symptoms develop, any additional concerns.   Please follow up with your primary care provider. If you do not have one, please see the information below.   To find a primary care or specialty doctor please call [phone] or [phone] to access "[place] Find a Doctor Service."  You may also go on the [facility] website at [email or web address]  There are also multiple [facility] practices throughout the [place] that are frequently accepting new patients. You may find a clinic that is close to your home and contact them.  [facility] - [address] [place] [phone]  [facility] in [place] (also locations in [place] and [place]) - [address] [place] [phone]  [facility] - [address] [place] [phone]
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
## 2017-02-10 ENCOUNTER — Other Ambulatory Visit: Payer: Self-pay

## 2017-02-10 ENCOUNTER — Emergency Department (HOSPITAL_COMMUNITY)
Admission: EM | Admit: 2017-02-10 | Discharge: 2017-02-10 | Disposition: A | Discharge status: Home / Self Care | Payer: Medicaid Other | Attending: Emergency Medicine | Admitting: Emergency Medicine

## 2017-02-10 ENCOUNTER — Emergency Department (HOSPITAL_COMMUNITY): Payer: Medicaid Other

## 2017-02-10 ENCOUNTER — Encounter (HOSPITAL_COMMUNITY): Payer: Self-pay | Admitting: Emergency Medicine

## 2017-02-10 DIAGNOSIS — Z79899 Other long term (current) drug therapy: Secondary | ICD-10-CM | POA: Diagnosis not present

## 2017-02-10 DIAGNOSIS — F17200 Nicotine dependence, unspecified, uncomplicated: Secondary | ICD-10-CM | POA: Insufficient documentation

## 2017-02-10 DIAGNOSIS — L03213 Periorbital cellulitis: Secondary | ICD-10-CM | POA: Diagnosis not present

## 2017-02-10 DIAGNOSIS — H1032 Unspecified acute conjunctivitis, left eye: Secondary | ICD-10-CM

## 2017-02-10 DIAGNOSIS — H10022 Other mucopurulent conjunctivitis, left eye: Secondary | ICD-10-CM | POA: Diagnosis present

## 2017-02-10 LAB — CBC
HEMATOCRIT: 35.1 % — AB (ref 39.0–52.0)
Hemoglobin: 12.2 g/dL — ABNORMAL LOW (ref 13.0–17.0)
MCH: 29.3 pg (ref 26.0–34.0)
MCHC: 34.8 g/dL (ref 30.0–36.0)
MCV: 84.2 fL (ref 78.0–100.0)
Platelets: 340 10*3/uL (ref 150–400)
RBC: 4.17 MIL/uL — ABNORMAL LOW (ref 4.22–5.81)
RDW: 14.6 % (ref 11.5–15.5)
WBC: 11.7 10*3/uL — ABNORMAL HIGH (ref 4.0–10.5)

## 2017-02-10 LAB — BASIC METABOLIC PANEL
ANION GAP: 13 (ref 5–15)
BUN: 11 mg/dL (ref 6–20)
CALCIUM: 9 mg/dL (ref 8.9–10.3)
CO2: 25 mmol/L (ref 22–32)
Chloride: 101 mmol/L (ref 101–111)
Creatinine, Ser: 0.89 mg/dL (ref 0.61–1.24)
GFR calc Af Amer: >60 mL/min (ref >60)
GFR calc non Af Amer: >60 mL/min (ref >60)
GLUCOSE: 87 mg/dL (ref 65–99)
Potassium: 4 mmol/L (ref 3.5–5.1)
Sodium: 139 mmol/L (ref 135–145)

## 2017-02-10 MED ORDER — AMOXICILLIN 500 MG PO CAPS
500.0000 mg | ORAL_CAPSULE | Freq: Once | ORAL | Status: AC
  Administered 2017-02-10: 500 mg via ORAL
  Filled 2017-02-10: qty 1

## 2017-02-10 MED ORDER — TETRACAINE HCL 0.5 % OP SOLN
2.0000 [drp] | Freq: Once | OPHTHALMIC | Status: AC
  Administered 2017-02-10: 2 [drp] via OPHTHALMIC
  Filled 2017-02-10: qty 4

## 2017-02-10 MED ORDER — AMOXICILLIN 500 MG PO CAPS: 500.0000 mg | ORAL_CAPSULE | Freq: Three times a day (TID) | ORAL | 0 refills | Status: AC

## 2017-02-10 MED ORDER — IOPAMIDOL (ISOVUE-300) INJECTION 61%
INTRAVENOUS | Status: AC
  Administered 2017-02-10: 75 mL
  Filled 2017-02-10: qty 75

## 2017-02-10 MED ORDER — SULFAMETHOXAZOLE-TRIMETHOPRIM 800-160 MG PO TABS
1.0000 | ORAL_TABLET | Freq: Once | ORAL | Status: AC
  Administered 2017-02-10: 1 via ORAL
  Filled 2017-02-10: qty 1

## 2017-02-10 MED ORDER — SULFAMETHOXAZOLE-TRIMETHOPRIM 800-160 MG PO TABS: 1.0000 | ORAL_TABLET | Freq: Every day | ORAL | 0 refills | Status: AC

## 2017-02-10 MED ORDER — POLYMYXIN B-TRIMETHOPRIM 10000-0.1 UNIT/ML-% OP SOLN: 2.0000 [drp] | OPHTHALMIC | 0 refills | Status: AC

## 2017-02-10 NOTE — Discharge Instructions (Signed)
You have been prescribed 10 days worth of antibiotics, please take antibiotics for 7 days and if symptoms haven't resolved you can discontinue at this time, extra days of antibiotics if symptoms persist. Please use the 2 by mouth antibiotics as well as the antibiotic eyedrops as prescribed. Please call tomorrow to schedule an appointment for follow-up with ophthalmology. If your symptoms are not improving despite these antibiotics with urinary os detailed below develop please return to the ED for sooner evaluation. Get help right away if: Your eye pain or swelling returns or it gets worse. You have any changes in your vision. You have a fever.

## 2017-02-10 NOTE — ED Notes (Signed)
See EDP assessment 

## 2017-02-10 NOTE — ED Notes (Signed)
Pt verbalizes understanding of d/c instructions. Pt received prescriptions. Pt ambulatory at d/c with all belongings.  

## 2017-02-10 NOTE — ED Notes (Signed)
ED Provider at bedside. 

## 2017-02-10 NOTE — ED Triage Notes (Signed)
Pt to ER for left eye swelling, itching and purulent drainage. NAD

## 2017-02-10 NOTE — ED Notes (Signed)
Patient transported to CT 
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
## 2017-02-15 LAB — EYE CULTURE

## 2017-02-16 ENCOUNTER — Telehealth: Payer: Self-pay | Admitting: Emergency Medicine

## 2017-02-16 NOTE — Telephone Encounter (Signed)
Post ED Visit - Positive Culture Follow-up  Culture report reviewed by antimicrobial stewardship pharmacist:  []  Roger Pacheco, Pharm.D. []  Roger Pacheco, [address]Ph[address] BCPS AQ-ID []  Roger Pacheco, Pharm.D., BCPS []  Roger Pacheco, Pharm.D., BCPS []  Roger Pacheco, [place] BCPS, AAHIVP []  Roger Pacheco, Pharm.D., BCPS, AAHIVP []  Roger Pacheco, PharmD, BCPS []  Roger Pacheco, PharmD, BCPS []  Roger Pacheco, PharmD, BCPS  Positive eye culture Treated with sulfamethoxazole-trimethoprim, organism sensitive to the same and no further patient follow-up is required at this time.  Roger Pacheco, Roger Pacheco 02/16/2017, 1:24 PM

## 2017-02-17 ENCOUNTER — Telehealth: Payer: Self-pay | Admitting: Emergency Medicine

## 2017-02-17 NOTE — Telephone Encounter (Signed)
Post ED Visit - Positive Culture Follow-up  Culture report reviewed by antimicrobial stewardship pharmacist:  []  Enzo BiNathan Batchelder, Pharm.D. []  Celedonio MiyamotoJeremy Frens, Pharm.D., BCPS AQ-ID [x]  Garvin FilaMike Maccia, Pharm.D., BCPS []  Georgina PillionElizabeth Martin, Pharm.D., BCPS []  GideonMinh Pham, [address]Ph[address] BCPS, AAHIVP []  Estella HuskMichelle Turner, Pharm.D., BCPS, AAHIVP []  Lysle Pearlachel Rumbarger, PharmD, BCPS []  Casilda Carlsaylor Stone, PharmD, BCPS []  Pollyann SamplesAndy Johnston, PharmD, BCPS  Positive eye culture Treated with sulfamethoxazole-trimethoprim and amoxicillin, organism sensitive to the same and no further patient follow-up is required at this time.  Berle MullMiller, Amalea Ottey 02/17/2017, 4:39 PM

## 2018-01-18 ENCOUNTER — Emergency Department (HOSPITAL_COMMUNITY)
Admission: EM | Admit: 2018-01-18 | Discharge: 2018-01-18 | Disposition: A | Discharge status: Home / Self Care | Payer: Medicaid Other | Attending: Emergency Medicine | Admitting: Emergency Medicine

## 2018-01-18 ENCOUNTER — Other Ambulatory Visit: Payer: Self-pay

## 2018-01-18 ENCOUNTER — Encounter (HOSPITAL_COMMUNITY): Payer: Self-pay

## 2018-01-18 DIAGNOSIS — F17210 Nicotine dependence, cigarettes, uncomplicated: Secondary | ICD-10-CM | POA: Insufficient documentation

## 2018-01-18 DIAGNOSIS — L0211 Cutaneous abscess of neck: Secondary | ICD-10-CM | POA: Insufficient documentation

## 2018-01-18 DIAGNOSIS — Z7982 Long term (current) use of aspirin: Secondary | ICD-10-CM | POA: Insufficient documentation

## 2018-01-18 DIAGNOSIS — Z79899 Other long term (current) drug therapy: Secondary | ICD-10-CM | POA: Diagnosis not present

## 2018-01-18 DIAGNOSIS — L0291 Cutaneous abscess, unspecified: Secondary | ICD-10-CM

## 2018-01-18 MED ORDER — LIDOCAINE-EPINEPHRINE (PF) 2 %-1:200000 IJ SOLN
10.0000 mL | Freq: Once | INTRAMUSCULAR | Status: AC
  Administered 2018-01-18: 10 mL
  Filled 2018-01-18: qty 10

## 2018-01-18 MED ORDER — SULFAMETHOXAZOLE-TRIMETHOPRIM 800-160 MG PO TABS: 1.0000 | ORAL_TABLET | Freq: Two times a day (BID) | ORAL | 0 refills | Status: AC

## 2018-01-18 NOTE — ED Triage Notes (Signed)
Pt endorses boils all over the body. Pt currently reports two to the back of his neck. 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 Pt reports he had some under his right arm pit, but they drained on his own.

## 2018-01-18 NOTE — Discharge Instructions (Signed)
Take antibiotics as directed return to ED for worsening symptoms, increased swelling, fever, redness.

## 2018-01-18 NOTE — ED Notes (Signed)
Patient verbalizes understanding of discharge instructions. Opportunity for questioning and answers were provided. Armband removed by staff, pt discharged from ED. Pt ambulatory to the lobby.  
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
## 2018-01-20 ENCOUNTER — Encounter (HOSPITAL_COMMUNITY): Payer: Self-pay

## 2018-01-20 ENCOUNTER — Emergency Department (HOSPITAL_COMMUNITY)
Admission: EM | Admit: 2018-01-20 | Discharge: 2018-01-20 | Disposition: A | Discharge status: Home / Self Care | Payer: Medicaid Other | Attending: Emergency Medicine | Admitting: Emergency Medicine

## 2018-01-20 DIAGNOSIS — R110 Nausea: Secondary | ICD-10-CM | POA: Diagnosis not present

## 2018-01-20 DIAGNOSIS — T50905A Adverse effect of unspecified drugs, medicaments and biological substances, initial encounter: Secondary | ICD-10-CM | POA: Diagnosis not present

## 2018-01-20 DIAGNOSIS — F17210 Nicotine dependence, cigarettes, uncomplicated: Secondary | ICD-10-CM | POA: Diagnosis not present

## 2018-01-20 DIAGNOSIS — Z79899 Other long term (current) drug therapy: Secondary | ICD-10-CM | POA: Insufficient documentation
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
## 2018-01-20 NOTE — ED Triage Notes (Signed)
Pt. Recently seen 2 days ago to lance boil on back of neck. Pt. Reports medications given make pt. Feel sick. Pt. Requesting to have medications changed. A/O X4
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
## 2019-10-26 IMAGING — CT CT MAXILLOFACIAL W/ CM
3 of 6 series · 15 of 47 positions shown, 18 images · IV contrast (APPLIED)
Comparison: CT head without contrast scratched at CT neck with
contrast 03/23/2010

CLINICAL DATA: Left eye swelling and drainage beginning today.

EXAM:
CT MAXILLOFACIAL WITH CONTRAST
TECHNIQUE: Multidetector CT imaging of the maxillofacial structures was
performed with intravenous contrast. Multiplanar CT image
reconstructions were also generated.
CONTRAST:  75mL TKZIWM-P22 IOPAMIDOL (TKZIWM-P22) INJECTION 61%

[Series 3: maxilllofacial 2.0 hr40 3 · axial · 0.30mm/px · z∈[-106,+38]mm · 10 of 84 slices shown, 13 images]
[im 6/84  brain]
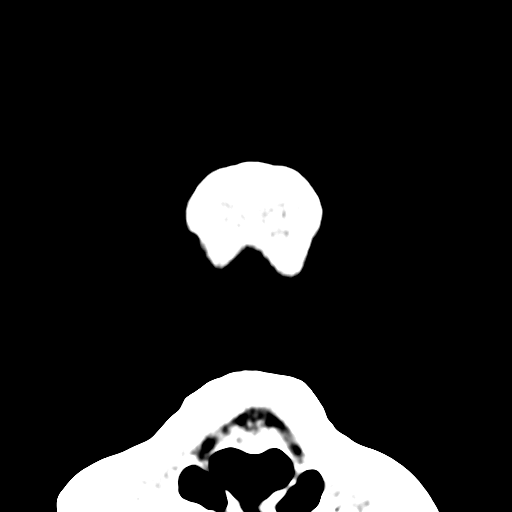
[im 6/84  bone]
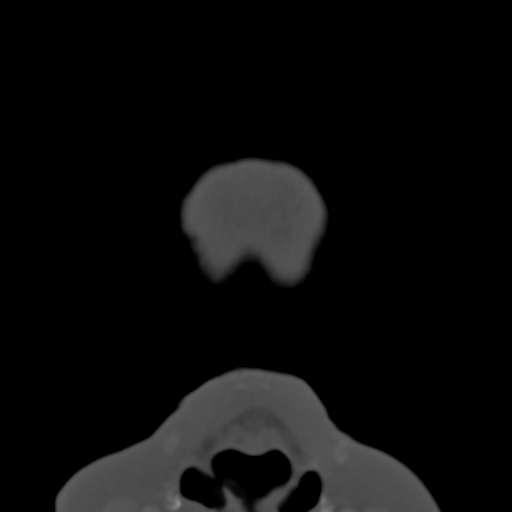
[im 12/84  bone]
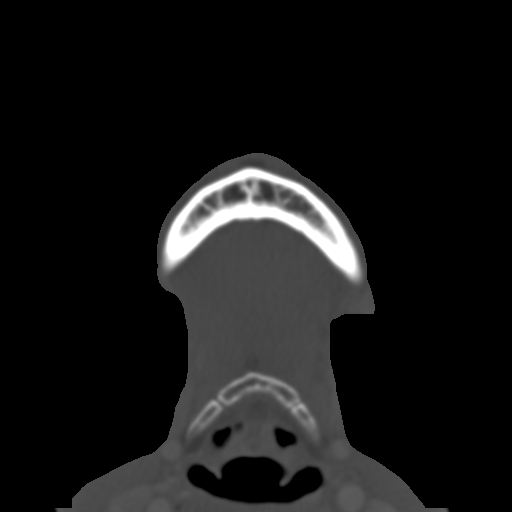
[im 24/84  bone]
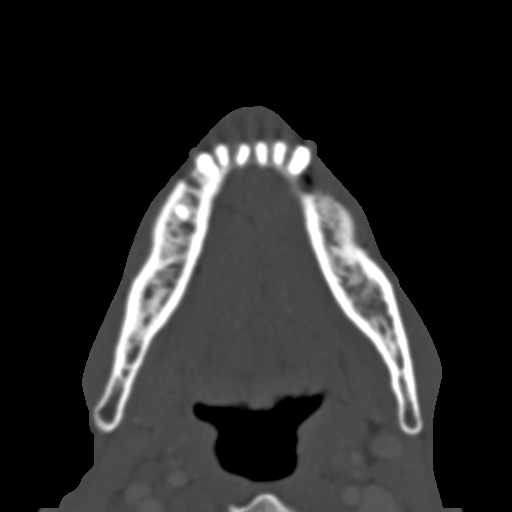
[im 30/84  bone]
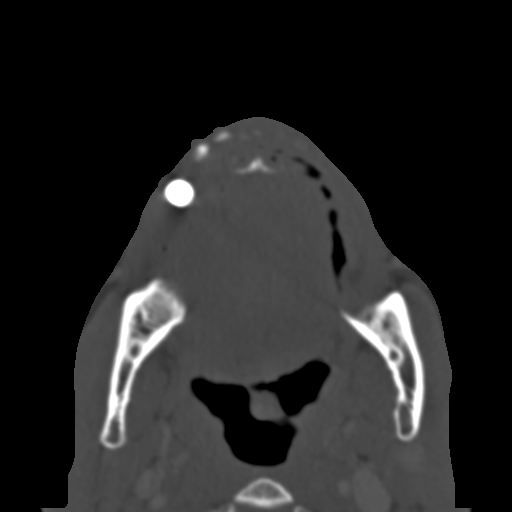
[im 36/84  brain]
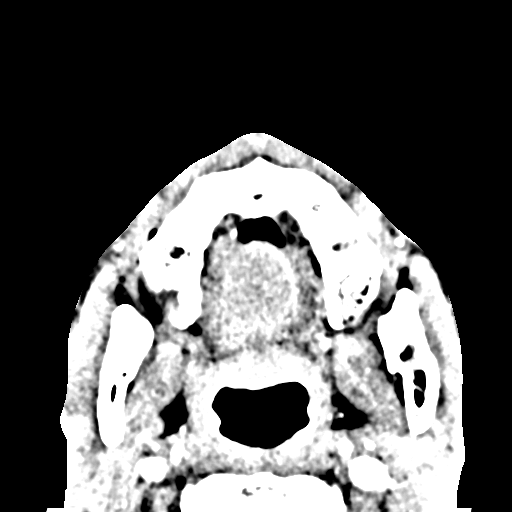
[im 36/84  bone]
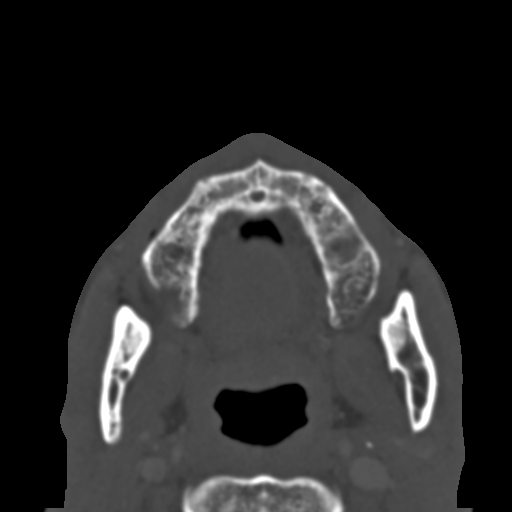
[im 48/84  bone]
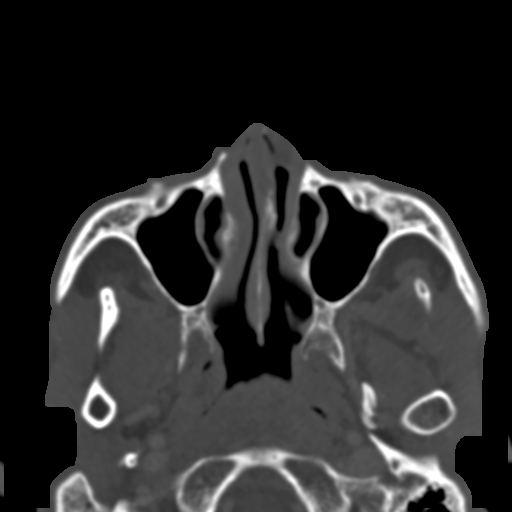
[im 54/84  bone]
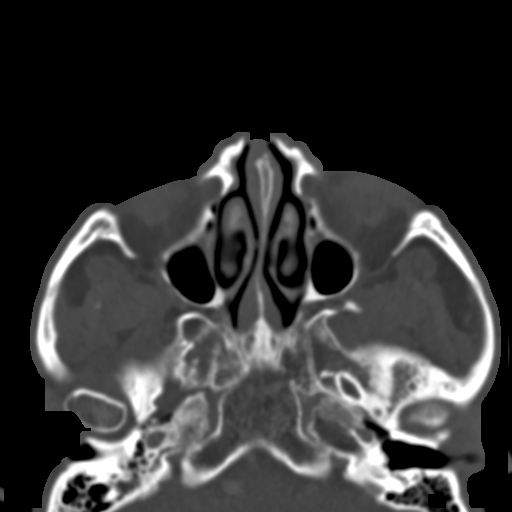
[im 60/84  bone]
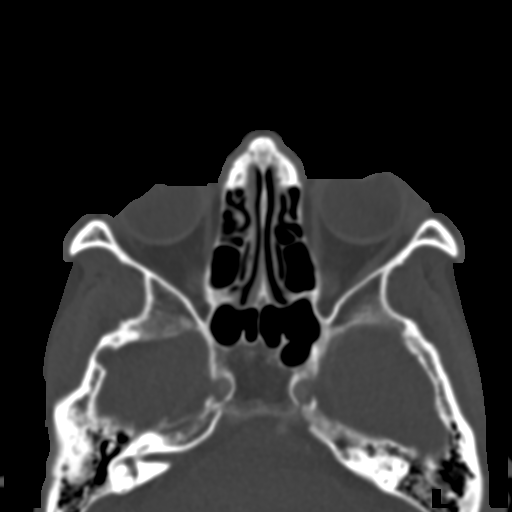
[im 72/84  brain]
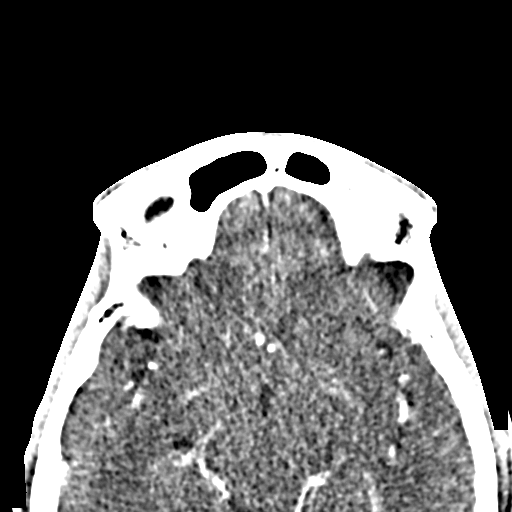
[im 72/84  bone]
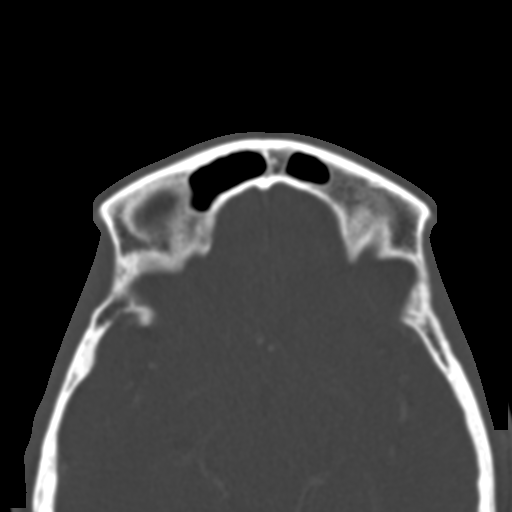
[im 78/84  bone]
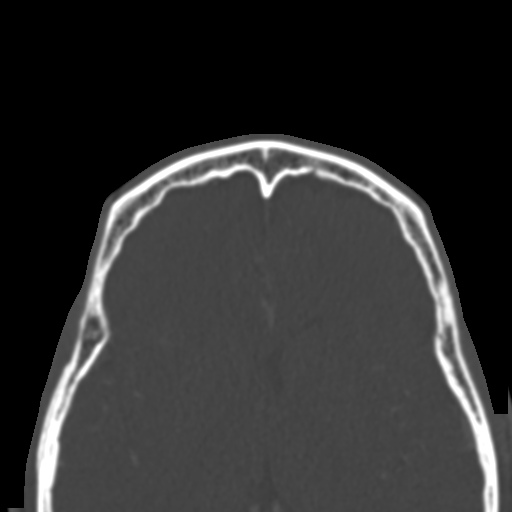

[Series 9: bone cor · coronal · 0.33mm/px · 3 of 76 slices shown]
[im 19/76  bone]
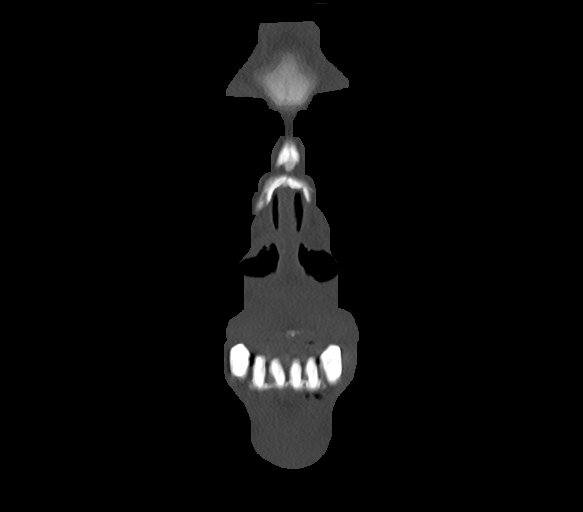
[im 38/76  bone]
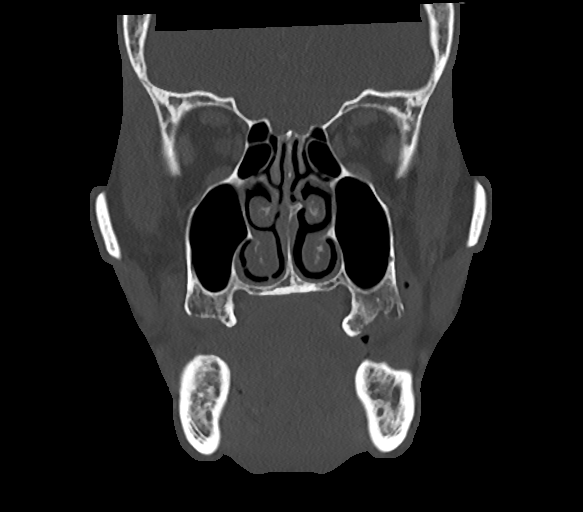
[im 57/76  bone]
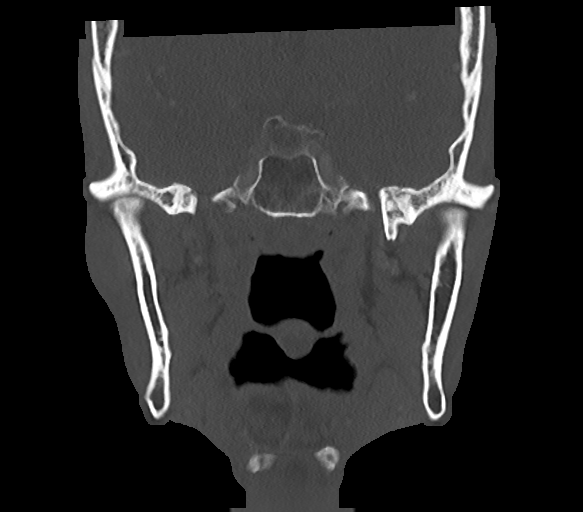

[Series 10: bone sag · sagittal · 0.30mm/px · 2 of 76 slices shown]
[im 26/76  bone]
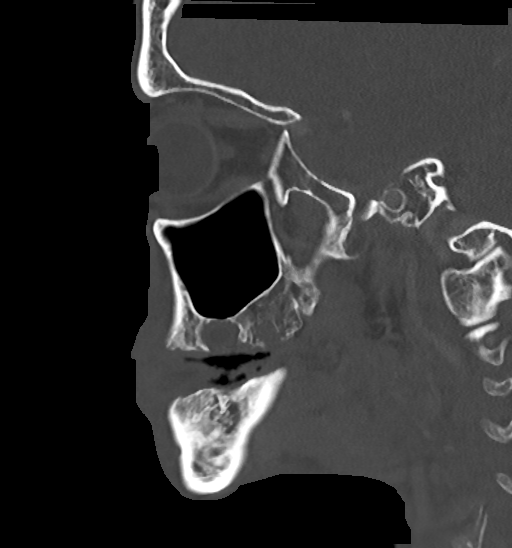
[im 51/76  bone]
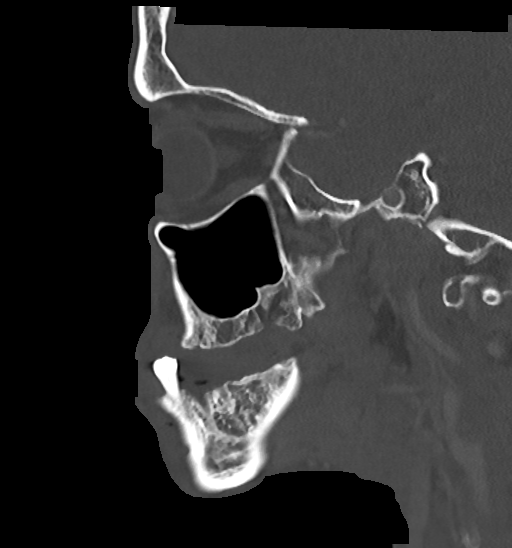

[15 of 47 positions shown; findings below may reference images not displayed]

FINDINGS: Osseous: No acute or healing fractures are present. No focal lytic
or blastic lesions are present. The mandible is intact and located.
The maxillary teeth are absent with extensive periodontal disease.
Anterior maxillary teeth remain.

Orbits: Left periorbital soft tissue swelling is mostly superior and
lateral. There is no postseptal invasion. The lacrimal gland is
within normal limits. The globe and optic nerves are normal
bilaterally. The extraocular muscles are within normal limits. The
cavernous sinus is normal. The optic chiasm is unremarkable.

Sinuses: The paranasal sinuses and mastoid air cells are clear.

Soft tissues: Soft tissues the face are within normal limits apart
from the left periorbital soft tissue swelling.

Limited intracranial: Within normal limits.
IMPRESSION: 1. Left superolateral periorbital cellulitis without postseptal
invasion.
2. No other mass lesion orbital process.
3. The patient is edentulous along the mandible with extensive
periodontal disease. No discrete source of infection is identified.

## 2021-09-29 ENCOUNTER — Ambulatory Visit (HOSPITAL_COMMUNITY)
Admission: EM | Admit: 2021-09-29 | Discharge: 2021-09-29 | Disposition: A | Discharge status: Home / Self Care | Payer: Medicaid Other | Attending: Family Medicine | Admitting: Family Medicine

## 2021-09-29 ENCOUNTER — Encounter (HOSPITAL_COMMUNITY): Payer: Self-pay | Admitting: Emergency Medicine

## 2021-09-29 DIAGNOSIS — E860 Dehydration: Secondary | ICD-10-CM

## 2021-09-29 DIAGNOSIS — R42 Dizziness and giddiness: Secondary | ICD-10-CM

## 2021-09-29 NOTE — ED Triage Notes (Signed)
Patient c/o dizziness since yesterday, worse when laying down.  Patient did have SOB.  No history of HTN.

## 2021-09-29 NOTE — Discharge Instructions (Addendum)
He is proceed to the emergency room for evaluation for possible dehydration
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
## 2021-11-26 ENCOUNTER — Emergency Department (HOSPITAL_COMMUNITY): Payer: Medicaid Other

## 2021-11-26 ENCOUNTER — Other Ambulatory Visit: Payer: Self-pay

## 2021-11-26 ENCOUNTER — Emergency Department (HOSPITAL_COMMUNITY)
Admission: EM | Admit: 2021-11-26 | Discharge: 2021-11-26 | Disposition: A | Discharge status: Home / Self Care | Payer: Medicaid Other | Attending: Emergency Medicine | Admitting: Emergency Medicine

## 2021-11-26 DIAGNOSIS — M25512 Pain in left shoulder: Secondary | ICD-10-CM | POA: Insufficient documentation

## 2021-11-26 DIAGNOSIS — W11XXXA Fall on and from ladder, initial encounter: Secondary | ICD-10-CM | POA: Insufficient documentation

## 2021-11-26 MED ORDER — IBUPROFEN 400 MG PO TABS
600.0000 mg | ORAL_TABLET | Freq: Once | ORAL | Status: AC
  Administered 2021-11-26: 600 mg via ORAL
  Filled 2021-11-26: qty 1

## 2021-11-26 MED ORDER — LIDOCAINE 5 % EX PTCH: 1.0000 | MEDICATED_PATCH | CUTANEOUS | 0 refills | Status: DC

## 2021-11-26 MED ORDER — LIDOCAINE 5 % EX PTCH
2.0000 | MEDICATED_PATCH | CUTANEOUS | Status: DC
  Administered 2021-11-26: 2 via TRANSDERMAL
  Filled 2021-11-26: qty 2

## 2021-11-26 NOTE — ED Provider Notes (Signed)
[facility] EMERGENCY DEPARTMENT Provider Note   CSN: [id number] Arrival date & time: 11/26/21  5397     History  Chief Complaint  Patient presents with   Shoulder Pain    Roger MICHAEL Sr. is a 61 y.o. male presents to the emergency room today for left shoulder pain persisting since fall weeks ago.  Patient states that he was climbing onto a roof, when he stepped off of the ladder on the top of the roof, he found it to be wet and he fell firmly on his left shoulder on the roof.  He did not fall off of the building.  No numbness tingling or weakness in the arm since that time; patient patient with pain with movement of the shoulder.  I personally reviewed his medical records.  Has history of arthritis.  No other medical diagnoses or medications he takes daily.  Has been taking BC powders at home with improvement in his pain.  HPI     Home Medications Prior to Admission medications   Medication Sig Start Date End Date Taking? Authorizing Provider  lidocaine (LIDODERM) 5 % Place 1 patch onto the skin daily. Remove & Discard patch within 12 hours or as directed by MD 11/26/21  Yes Bryana Froemming, Gypsy Balsam, PA-C  Aspirin-Salicylamide-Caffeine (BC HEADACHE POWDER PO) Take 1-2 packets by mouth every 6 (six) hours as needed (for pain or headache).    [provider]      Allergies    Butter flavor [flavoring agent] and Milk-related compounds    Review of Systems   Review of Systems  Musculoskeletal:        Shoulder pain    Physical Exam Updated Vital Signs BP (!) 149/94 (BP Location: Right Arm)   Pulse 68   Temp 98.4 F (36.9 C) (Oral)   Resp 18   Ht 5' 7.5" (1.715 m)   Wt 61.2 kg   SpO2 100%   BMI 20.83 kg/m  Physical Exam Vitals and nursing note reviewed.  Constitutional:      Appearance: He is not ill-appearing or toxic-appearing.  HENT:     Head: Normocephalic and atraumatic.  Eyes:     General: No scleral icterus.       Right eye: No  discharge.        Left eye: No discharge.     Conjunctiva/sclera: Conjunctivae normal.  Cardiovascular:     Heart sounds: Normal heart sounds.  Pulmonary:     Effort: Pulmonary effort is normal.  Musculoskeletal:     Right shoulder: Normal.     Left shoulder: Tenderness present. No swelling, deformity, laceration, bony tenderness or crepitus. Decreased range of motion. Normal strength.     Right upper arm: Normal.     Left upper arm: Normal.     Right elbow: Normal.     Left elbow: Normal.     Right wrist: Normal.     Left wrist: Normal.     Right hand: Normal.     Left hand: Normal.     Comments: Pain in the left with extreme anterior flexion of the shoulder, no pain with extension of the shoulder.  Pain in the left shoulder with abduction past 45 degrees and with internal rotation.  Pain with empty can test.  Normal radial pulse and capillary fill.  Normal grip strength.  Skin:    General: Skin is warm and dry.     Capillary Refill: Capillary refill takes less than 2 seconds.  Neurological:     General: No focal deficit present.     Mental Status: He is alert.  Psychiatric:        Mood and Affect: Mood normal.     ED Results / Procedures / Treatments   Labs (all labs ordered are listed, but only abnormal results are displayed) Labs Reviewed - No data to display  EKG None  Radiology DG Shoulder Left  Result Date: 11/26/2021 CLINICAL DATA:  Left shoulder pain after fall 3 weeks ago. EXAM: LEFT SHOULDER - 2+ VIEW COMPARISON:  Left shoulder radiographs 04/05/2013 FINDINGS: Mild glenohumeral joint space narrowing. Neuro alignment of the glenohumeral and acromioclavicular joints. Minimal lateral subacromial spurring. No acute fracture or dislocation. Mild-to-moderate vascular calcifications within the aortic arch. IMPRESSION: No acute fracture. Electronically Signed   By: Yvonne Kendall M.D.   On: 11/26/2021 10:45    Procedures Procedures    Medications Ordered in  ED Medications  ibuprofen (ADVIL) tablet 600 mg (has no administration in time range)  lidocaine (LIDODERM) 5 % 2 patch (has no administration in time range)    ED Course/ Medical Decision Making/ A&P                           Medical Decision Making 61 year old male presents with concern for pain in the left shoulder.  HTN on intake, VS otherwise normal. Neurovascularly intact in the upper extremities. NO osseous TTP, muscular pain and pain with ROM as above.   DDX includes presented to osseous injury, muscular strain, ligamentous sprain, tendinous injury within the shoulder, AC joint separation.    Amount and/or Complexity of Data Reviewed Radiology:     Details: Plain film of the left shoulder with normal AC joint alignment, no acute osseous abnormality.  Visualized by this provider and agree with radiologist interpretation.   Risk Prescription drug management.   Clinical picture most consistent with acute muscular strain +/- tendinous injury within the shoulder.  Will offer lidocaine patches in the ED and recommend continued OTC analgesia in the outpatient setting.  Will provide work note and sports medicine follow-up.  No further work-up warranted in the ER at this time.  [place]  voiced understanding of her medical evaluation and treatment plan. Each of their questions answered to their expressed satisfaction.  Return precautions were given.  Patient is well-appearing, stable, and was discharged in good condition.  This chart was dictated using voice recognition software, Dragon. Despite the best efforts of this provider to proofread and correct errors, errors may still occur which can change documentation meaning.  Final Clinical Impression(s) / ED Diagnoses Final diagnoses:  Acute pain of left shoulder    Rx / DC Orders ED Discharge Orders          Ordered    lidocaine (LIDODERM) 5 %  Every 24 hours        11/26/21 [place], Gypsy Balsam,  PA-C 11/26/21 1609    Tegeler, Gwenyth Allegra, MD 11/26/21 2140

## 2021-11-26 NOTE — Discharge Instructions (Addendum)
You were seen in the emergency department today for your shoulder pain.  Your physical exam and vital signs are very reassuring.  The muscles in your shoulder are in what is called spasm, meaning they are inappropriately tightened up.  This can be quite painful.  To help with your pain you may take Tylenol and / or NSAID medication (such as ibuprofen or naproxen) to help with your pain.  Additionally, you may have injury to the tendons in the shoulder.  Please follow up with the sports medicine provider listed below.   You may also utilize topical pain relief such as Biofreeze, IcyHot, or topical lidocaine patches.  I also recommend that you apply heat to the area, such as a hot shower or heating pad, and follow heat application with massage of the muscles that are most tight.  Please return to the emergency department if you develop any numbness/tingling/weakness in your arms or legs, any difficulty urinating, or urinary incontinence chest pain, shortness of breath, abdominal pain, nausea or vomiting that does not stop, or any other new severe symptoms.

## 2021-11-26 NOTE — ED Triage Notes (Signed)
Pt. Stated, I hurt my left shoulder about 2 weeks and is no better

## 2021-11-26 NOTE — ED Provider Triage Note (Signed)
Emergency Medicine Provider Triage Evaluation Note  Roger Kindle Sr. , a 61 y.o. male  was evaluated in triage.  Pt complains of left shoulder pain status post fall couple weeks ago.  Pain is exacerbated with lifting of the left arm or head.  Has not taken any medication for improvement in symptoms.  Review of Systems  Positive: Left shoulder Negative: Fever, chest pain  Physical Exam  There were no vitals taken for this visit. Gen:   Awake, no distress   Resp:  Normal effort  MSK:   Moves extremities without difficulty  Other:    Medical Decision Making  Medically screening exam initiated at 10:01 AM.  Appropriate orders placed.  Roger Kindle Sr. was informed that the remainder of the evaluation will be completed by another provider, this initial triage assessment does not replace that evaluation, and the importance of remaining in the ED until their evaluation is complete.     Janeece Fitting, PA-C 11/26/21 1009

## 2022-08-13 ENCOUNTER — Encounter (HOSPITAL_COMMUNITY): Payer: Self-pay

## 2022-08-13 ENCOUNTER — Emergency Department (HOSPITAL_COMMUNITY): Payer: Medicaid Other

## 2022-08-13 ENCOUNTER — Emergency Department (HOSPITAL_COMMUNITY)
Admission: EM | Admit: 2022-08-13 | Discharge: 2022-08-13 | Disposition: A | Discharge status: Home / Self Care | Payer: Medicaid Other | Attending: Emergency Medicine | Admitting: Emergency Medicine

## 2022-08-13 ENCOUNTER — Other Ambulatory Visit: Payer: Self-pay

## 2022-08-13 DIAGNOSIS — E876 Hypokalemia: Secondary | ICD-10-CM | POA: Diagnosis not present

## 2022-08-13 DIAGNOSIS — D72829 Elevated white blood cell count, unspecified: Secondary | ICD-10-CM | POA: Insufficient documentation

## 2022-08-13 DIAGNOSIS — Z7982 Long term (current) use of aspirin: Secondary | ICD-10-CM | POA: Diagnosis not present

## 2022-08-13 DIAGNOSIS — L03114 Cellulitis of left upper limb: Secondary | ICD-10-CM | POA: Diagnosis not present

## 2022-08-13 DIAGNOSIS — M25522 Pain in left elbow: Secondary | ICD-10-CM | POA: Diagnosis present

## 2022-08-13 LAB — CBC WITH DIFFERENTIAL/PLATELET
Abs Immature Granulocytes: 0.03 10*3/uL (ref 0.00–0.07)
Basophils Absolute: 0.1 10*3/uL (ref 0.0–0.1)
Basophils Relative: 1 %
Eosinophils Absolute: 0.3 10*3/uL (ref 0.0–0.5)
Eosinophils Relative: 2 %
HCT: 34.9 % — ABNORMAL LOW (ref 39.0–52.0)
Hemoglobin: 12.1 g/dL — ABNORMAL LOW (ref 13.0–17.0)
Immature Granulocytes: 0 %
Lymphocytes Relative: 20 %
Lymphs Abs: 2.3 10*3/uL (ref 0.7–4.0)
MCH: 29.7 pg (ref 26.0–34.0)
MCHC: 34.7 g/dL (ref 30.0–36.0)
MCV: 85.5 fL (ref 80.0–100.0)
Monocytes Absolute: 1 10*3/uL (ref 0.1–1.0)
Monocytes Relative: 9 %
Neutro Abs: 7.5 10*3/uL (ref 1.7–7.7)
Neutrophils Relative %: 68 %
Platelets: 256 10*3/uL (ref 150–400)
RBC: 4.08 MIL/uL — ABNORMAL LOW (ref 4.22–5.81)
RDW: 14.7 % (ref 11.5–15.5)
WBC: 11.2 10*3/uL — ABNORMAL HIGH (ref 4.0–10.5)
nRBC: 0 % (ref 0.0–0.2)

## 2022-08-13 LAB — COMPREHENSIVE METABOLIC PANEL
ALT: 14 U/L (ref 0–44)
AST: 19 U/L (ref 15–41)
Albumin: 3.5 g/dL (ref 3.5–5.0)
Alkaline Phosphatase: 68 U/L (ref 38–126)
Anion gap: 11 (ref 5–15)
BUN: 5 mg/dL — ABNORMAL LOW (ref 8–23)
CO2: 25 mmol/L (ref 22–32)
Calcium: 8.9 mg/dL (ref 8.9–10.3)
Chloride: 104 mmol/L (ref 98–111)
Creatinine, Ser: 0.85 mg/dL (ref 0.61–1.24)
GFR, Estimated: >60 mL/min (ref >60)
Glucose, Bld: 80 mg/dL (ref 70–99)
Potassium: 3.1 mmol/L — ABNORMAL LOW (ref 3.5–5.1)
Sodium: 140 mmol/L (ref 135–145)
Total Bilirubin: 0.3 mg/dL (ref 0.3–1.2)
Total Protein: 6.8 g/dL (ref 6.5–8.1)

## 2022-08-13 LAB — LACTIC ACID, PLASMA: Lactic Acid, Venous: 1.3 mmol/L (ref 0.5–1.9)

## 2022-08-13 MED ORDER — DOXYCYCLINE HYCLATE 100 MG PO TABS
100.0000 mg | ORAL_TABLET | Freq: Once | ORAL | Status: AC
  Administered 2022-08-13: 100 mg via ORAL
  Filled 2022-08-13: qty 1

## 2022-08-13 MED ORDER — POTASSIUM CHLORIDE CRYS ER 20 MEQ PO TBCR
40.0000 meq | EXTENDED_RELEASE_TABLET | Freq: Once | ORAL | Status: AC
  Administered 2022-08-13: 40 meq via ORAL
  Filled 2022-08-13: qty 2

## 2022-08-13 MED ORDER — DOXYCYCLINE HYCLATE 100 MG PO CAPS: 100.0000 mg | ORAL_CAPSULE | Freq: Two times a day (BID) | ORAL | 0 refills | Status: DC

## 2022-08-13 NOTE — Discharge Instructions (Addendum)
You should keep the area clean with soap and water and soak it in soapy water a few times a day.  You are being prescribed an antibiotic to help treat the skin infection.   If you notice new or worsening redness, swelling, fevers, or any other new/concerning symptoms then return to the ER or call 911.

## 2022-08-13 NOTE — ED Triage Notes (Signed)
Pt c/o pain and swelling to L elbow x 2 days; states he set elbow down on a piece of metal and felt like something got it it but could not find anything; denies fevers

## 2022-08-13 NOTE — ED Provider Notes (Signed)
[place] EMERGENCY DEPARTMENT AT [facility] Provider Note   CSN: [id number] Arrival date & time: 08/13/22  1132     History  Chief Complaint  Patient presents with   Joint Swelling    Roger Jasmine Sr. is a 62 y.o. male.  HPI 62 year old male presents with concern for left elbow infection.  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  He did not actually look at it but rubbed it off with a towel.  This was 3 days ago.  Yesterday he noticed pain and swelling to his elbow.  Today some of his forearm is swollen.  While in the waiting room he states that he had a good amount of purulence come out and the swelling seems to have deflated.  There is no significant pain.  Home Medications Prior to Admission medications   Medication Sig Start Date End Date Taking? Authorizing Provider  doxycycline (VIBRAMYCIN) 100 MG capsule Take 1 capsule (100 mg total) by mouth 2 (two) times daily. One po bid x 7 days 08/13/22  Yes Pricilla Loveless, MD  Aspirin-Salicylamide-Caffeine [facility] HEADACHE POWDER PO) Take 1-2 packets by mouth every 6 (six) hours as needed (for pain or headache).    [provider]  lidocaine (LIDODERM) 5 % Place 1 patch onto the skin daily. Remove & Discard patch within 12 hours or as directed by MD 11/26/21   Sponseller, Eugene Gavia, PA-C      Allergies    Butter flavor [flavoring agent] and Milk-related compounds    Review of Systems   Review of Systems  Musculoskeletal:  Positive for arthralgias and joint swelling.  Skin:  Positive for color change.    Physical Exam Updated Vital Signs BP (!) 150/72 (BP Location: Right Arm)   Pulse 80   Temp 98.6 F (37 C) (Oral)   Resp 19   Ht 5\' 7"  (1.702 m)   Wt 58.1 kg   SpO2 100%   BMI 20.05 kg/m  Physical Exam Vitals and nursing note reviewed.  Constitutional:      Appearance: He is well-developed.  HENT:     Head: Normocephalic and atraumatic.  Cardiovascular:      Rate and Rhythm: Normal rate and regular rhythm.     Pulses:          Radial pulses are 2+ on the left side.  Pulmonary:     Effort: Pulmonary effort is normal.  Abdominal:     General: There is no distension.  Musculoskeletal:     Left elbow: Normal range of motion. No tenderness.       Arms:     Comments: Normal radial, ulnar, median nerve testing in left hand  Skin:    General: Skin is warm and dry.  Neurological:     Mental Status: He is alert.     ED Results / Procedures / Treatments   Labs (all labs ordered are listed, but only abnormal results are displayed) Labs Reviewed  COMPREHENSIVE METABOLIC PANEL - Abnormal; Notable for the following components:      Result Value   Potassium 3.1 (*)    BUN 5 (*)    All other components within normal limits  CBC WITH DIFFERENTIAL/PLATELET - Abnormal; Notable for the following components:   WBC 11.2 (*)    RBC 4.08 (*)    Hemoglobin 12.1 (*)    HCT 34.9 (*)    All other components within normal limits  LACTIC ACID, PLASMA    EKG None  Radiology DG Elbow Complete Left  Result Date: 08/13/2022 CLINICAL DATA:  Elbow pain and swelling. EXAM: LEFT ELBOW - COMPLETE 3+ VIEW COMPARISON:  None Available. FINDINGS: No signs of a joint effusion. No acute fracture or dislocation identified. No significant arthropathy. No radiopaque foreign bodies. IMPRESSION: No acute findings. Electronically Signed   By: Signa Kell M.D.   On: 08/13/2022 12:43    Procedures Procedures    Medications Ordered in ED Medications  potassium chloride SA (KLOR-CON M) CR tablet 40 mEq (40 mEq Oral Given 08/13/22 1804)  doxycycline (VIBRA-TABS) tablet 100 mg (100 mg Oral Given 08/13/22 1804)    ED Course/ Medical Decision Making/ A&P                             Medical Decision Making Amount and/or Complexity of Data Reviewed Labs: ordered.    Details: Mild hypokalemia.  Mild leukocytosis. Radiology: ordered and independent interpretation  performed.    Details: No fracture/joint effusion.  Risk Prescription drug management.   Patient presents with what appears to be purulent cellulitis.  He reports that he had some purulence coming out though I do not feel any actual fluctuance at this time.  Did offer we could try to incise it but I do not think there is much there.  He would rather just do some antibiotics and wound care.  X-ray is unremarkable.  No sepsis.  No systemic symptoms.  Is hypertensive here though slowly improving.  Discussed he will need to follow-up closely with an outpatient PCP to further evaluate for possible hypertension.  Otherwise we will treat with doxycycline and have him follow-up with a PCP.  Given return precautions and        Final Clinical Impression(s) / ED Diagnoses Final diagnoses:  Cellulitis of left elbow  Hypokalemia    Rx / DC Orders ED Discharge Orders          Ordered    doxycycline (VIBRAMYCIN) 100 MG capsule  2 times daily        08/13/22 1756              Pricilla Loveless, MD 08/13/22 1814

## 2023-05-02 ENCOUNTER — Other Ambulatory Visit: Payer: Self-pay

## 2023-05-02 ENCOUNTER — Emergency Department (HOSPITAL_COMMUNITY)
Admission: EM | Admit: 2023-05-02 | Discharge: 2023-05-02 | Disposition: A | Discharge status: Home / Self Care | Payer: Self-pay | Attending: Emergency Medicine | Admitting: Emergency Medicine

## 2023-05-02 ENCOUNTER — Emergency Department (HOSPITAL_COMMUNITY): Payer: Self-pay

## 2023-05-02 DIAGNOSIS — R7989 Other specified abnormal findings of blood chemistry: Secondary | ICD-10-CM | POA: Insufficient documentation

## 2023-05-02 DIAGNOSIS — R531 Weakness: Secondary | ICD-10-CM | POA: Diagnosis not present

## 2023-05-02 DIAGNOSIS — F17210 Nicotine dependence, cigarettes, uncomplicated: Secondary | ICD-10-CM | POA: Diagnosis not present

## 2023-05-02 DIAGNOSIS — R748 Abnormal levels of other serum enzymes: Secondary | ICD-10-CM | POA: Diagnosis not present

## 2023-05-02 DIAGNOSIS — K2900 Acute gastritis without bleeding: Secondary | ICD-10-CM | POA: Insufficient documentation

## 2023-05-02 DIAGNOSIS — R0789 Other chest pain: Secondary | ICD-10-CM | POA: Diagnosis present

## 2023-05-02 LAB — CBC WITH DIFFERENTIAL/PLATELET
Abs Immature Granulocytes: 0.01 10*3/uL (ref 0.00–0.07)
Basophils Absolute: 0 10*3/uL (ref 0.0–0.1)
Basophils Relative: 1 %
Eosinophils Absolute: 0.2 10*3/uL (ref 0.0–0.5)
Eosinophils Relative: 3 %
HCT: 40.8 % (ref 39.0–52.0)
Hemoglobin: 13.8 g/dL (ref 13.0–17.0)
Immature Granulocytes: 0 %
Lymphocytes Relative: 49 %
Lymphs Abs: 3.5 10*3/uL (ref 0.7–4.0)
MCH: 29.1 pg (ref 26.0–34.0)
MCHC: 33.8 g/dL (ref 30.0–36.0)
MCV: 86.1 fL (ref 80.0–100.0)
Monocytes Absolute: 0.6 10*3/uL (ref 0.1–1.0)
Monocytes Relative: 9 %
Neutro Abs: 2.7 10*3/uL (ref 1.7–7.7)
Neutrophils Relative %: 38 %
Platelets: 229 10*3/uL (ref 150–400)
RBC: 4.74 MIL/uL (ref 4.22–5.81)
RDW: 16.1 % — ABNORMAL HIGH (ref 11.5–15.5)
WBC: 7 10*3/uL (ref 4.0–10.5)
nRBC: 0 % (ref 0.0–0.2)

## 2023-05-02 LAB — HEPATITIS PANEL, ACUTE
HCV Ab: NONREACTIVE
Hep A IgM: NONREACTIVE
Hep B C IgM: NONREACTIVE
Hepatitis B Surface Ag: NONREACTIVE

## 2023-05-02 LAB — TROPONIN I (HIGH SENSITIVITY): Troponin I (High Sensitivity): 5 ng/L (ref <18)

## 2023-05-02 LAB — COMPREHENSIVE METABOLIC PANEL
ALT: 82 U/L — ABNORMAL HIGH (ref 0–44)
AST: 83 U/L — ABNORMAL HIGH (ref 15–41)
Albumin: 3.9 g/dL (ref 3.5–5.0)
Alkaline Phosphatase: 55 U/L (ref 38–126)
Anion gap: 12 (ref 5–15)
BUN: 18 mg/dL (ref 8–23)
CO2: 23 mmol/L (ref 22–32)
Calcium: 9.1 mg/dL (ref 8.9–10.3)
Chloride: 104 mmol/L (ref 98–111)
Creatinine, Ser: 0.88 mg/dL (ref 0.61–1.24)
GFR, Estimated: >60 mL/min (ref >60)
Glucose, Bld: 92 mg/dL (ref 70–99)
Potassium: 4 mmol/L (ref 3.5–5.1)
Sodium: 139 mmol/L (ref 135–145)
Total Bilirubin: 0.3 mg/dL (ref 0.0–1.2)
Total Protein: 7.7 g/dL (ref 6.5–8.1)

## 2023-05-02 LAB — URINALYSIS, W/ REFLEX TO CULTURE (INFECTION SUSPECTED)
Bacteria, UA: NONE SEEN
Bilirubin Urine: NEGATIVE
Glucose, UA: NEGATIVE mg/dL
Ketones, ur: NEGATIVE mg/dL
Leukocytes,Ua: NEGATIVE
Nitrite: NEGATIVE
Protein, ur: NEGATIVE mg/dL
Specific Gravity, Urine: 1.009 (ref 1.005–1.030)
pH: 5 (ref 5.0–8.0)

## 2023-05-02 LAB — RESP PANEL BY RT-PCR (RSV, FLU A&B, COVID)  RVPGX2
Influenza A by PCR: NEGATIVE
Influenza B by PCR: NEGATIVE
Resp Syncytial Virus by PCR: NEGATIVE
SARS Coronavirus 2 by RT PCR: NEGATIVE

## 2023-05-02 MED ORDER — IOHEXOL 300 MG/ML  SOLN
100.0000 mL | Freq: Once | INTRAMUSCULAR | Status: AC | PRN
  Administered 2023-05-02: 100 mL via INTRAVENOUS

## 2023-05-02 MED ORDER — PANTOPRAZOLE SODIUM 40 MG IV SOLR
40.0000 mg | Freq: Once | INTRAVENOUS | Status: AC
  Administered 2023-05-02: 40 mg via INTRAVENOUS
  Filled 2023-05-02: qty 10

## 2023-05-02 MED ORDER — ALBUTEROL SULFATE (2.5 MG/3ML) 0.083% IN NEBU
5.0000 mg | INHALATION_SOLUTION | Freq: Once | RESPIRATORY_TRACT | Status: AC
  Administered 2023-05-02: 5 mg via RESPIRATORY_TRACT
  Filled 2023-05-02: qty 6

## 2023-05-02 MED ORDER — LACTATED RINGERS IV BOLUS
1000.0000 mL | Freq: Once | INTRAVENOUS | Status: AC
  Administered 2023-05-02: 1000 mL via INTRAVENOUS

## 2023-05-02 MED ORDER — ONDANSETRON HCL 4 MG/2ML IJ SOLN
4.0000 mg | Freq: Once | INTRAMUSCULAR | Status: AC
  Administered 2023-05-02: 4 mg via INTRAVENOUS
  Filled 2023-05-02: qty 2

## 2023-05-02 MED ORDER — SUCRALFATE 1 G PO TABS: 1.0000 g | ORAL_TABLET | Freq: Three times a day (TID) | ORAL | 0 refills | Status: DC

## 2023-05-02 MED ORDER — PANTOPRAZOLE SODIUM 20 MG PO TBEC: 20.0000 mg | DELAYED_RELEASE_TABLET | Freq: Every day | ORAL | 0 refills | Status: DC

## 2023-05-02 MED ORDER — ALBUTEROL SULFATE HFA 108 (90 BASE) MCG/ACT IN AERS: 2.0000 | INHALATION_SPRAY | RESPIRATORY_TRACT | Status: DC | PRN

## 2023-05-02 NOTE — ED Provider Notes (Signed)
 [facility]GENCY [facility]MENT AT [facility] Provider Note   CSN: [id number] Arrival date & time: 05/02/23  0865     History  Chief Complaint  Patient presents with   Shortness of Breath    Roger COOMBS Sr. is a 63 y.o. male.  Patient is a 63 year old male with no significant past medical history who presents with cough and weakness.  He said he has been feeling bad for about 3 weeks.  He has had some coughing mostly at night.  He has not been sleeping well.  He has been sleeping more during the daytime.  Has had some nausea and decreased appetite.  He seems to have had some weight loss.  He has not been able to eat much because of the decreased appetite.  He does not have any vomiting or diarrhea.  No significant rhinorrhea.  No known fevers.  He has had a couple of nosebleeds.  He denies any alcohol or drug use.  He was using marijuana but stopped about 2 months ago.  He does smoke cigarettes but has not smoked in the last week since this cough has been going on.  He had some pain in his left lower chest which he says was worse with coughing.  He describes some burning in his abdomen.  He has diffuse myalgias.       Home Medications Prior to Admission medications   Medication Sig Start Date End Date Taking? Authorizing Provider  pantoprazole (PROTONIX) 20 MG tablet Take 1 tablet (20 mg total) by mouth daily. 05/02/23  Yes Rolan Bucco, MD  sucralfate (CARAFATE) 1 g tablet Take 1 tablet (1 g total) by mouth 4 (four) times daily -  with meals and at bedtime. 05/02/23  Yes Rolan Bucco, MD  Aspirin-Salicylamide-Caffeine (BC HEADACHE POWDER PO) Take 1-2 packets by mouth every 6 (six) hours as needed (for pain or headache).    [provider]  doxycycline (VIBRAMYCIN) 100 MG capsule Take 1 capsule (100 mg total) by mouth 2 (two) times daily. One po bid x 7 days 08/13/22   Pricilla Loveless, MD  lidocaine (LIDODERM) 5 % Place 1 patch onto the skin daily. Remove &  Discard patch within 12 hours or as directed by MD 11/26/21   Sponseller, Eugene Gavia, PA-C      Allergies    Butter flavor [flavoring agent (non-screening)] and Milk-related compounds    Review of Systems   Review of Systems  Constitutional:  Positive for appetite change and fatigue. Negative for chills, diaphoresis and fever.  HENT:  Negative for congestion, rhinorrhea and sneezing.   Eyes: Negative.   Respiratory:  Positive for cough and shortness of breath. Negative for chest tightness.   Cardiovascular:  Positive for chest pain. Negative for leg swelling.  Gastrointestinal:  Positive for nausea. Negative for abdominal pain, blood in stool, diarrhea and vomiting.  Genitourinary:  Negative for difficulty urinating, flank pain, frequency and hematuria.  Musculoskeletal:  Positive for myalgias. Negative for arthralgias and back pain.  Skin:  Negative for rash.  Neurological:  Negative for dizziness, speech difficulty, weakness, numbness and headaches.    Physical Exam Updated Vital Signs BP 131/71   Pulse 75   Temp 97.6 F (36.4 C) (Oral)   Resp 17   Ht 5\' 7"  (1.702 m)   Wt 64.9 kg   SpO2 99%   BMI 22.40 kg/m  Physical Exam Constitutional:      Appearance: He is well-developed.  HENT:  Head: Normocephalic and atraumatic.  Eyes:     Pupils: Pupils are equal, round, and reactive to light.  Cardiovascular:     Rate and Rhythm: Normal rate and regular rhythm.     Heart sounds: Normal heart sounds.  Pulmonary:     Effort: Pulmonary effort is normal. No respiratory distress.     Breath sounds: Decreased breath sounds, wheezing and rhonchi present. No rales.  Chest:     Chest wall: No tenderness.  Abdominal:     General: Bowel sounds are normal.     Palpations: Abdomen is soft.     Tenderness: There is no abdominal tenderness. There is no guarding or rebound.  Musculoskeletal:        General: Normal range of motion.     Cervical back: Normal range of motion and neck  supple.     Comments: No edema or calf tenderness  Lymphadenopathy:     Cervical: No cervical adenopathy.  Skin:    General: Skin is warm and dry.     Findings: No rash.  Neurological:     Mental Status: He is alert and oriented to person, place, and time.     ED Results / Procedures / Treatments   Labs (all labs ordered are listed, but only abnormal results are displayed) Labs Reviewed  CBC WITH DIFFERENTIAL/PLATELET - Abnormal; Notable for the following components:      Result Value   RDW 16.1 (*)    All other components within normal limits  COMPREHENSIVE METABOLIC PANEL - Abnormal; Notable for the following components:   AST 83 (*)    ALT 82 (*)    All other components within normal limits  URINALYSIS, W/ REFLEX TO CULTURE (INFECTION SUSPECTED) - Abnormal; Notable for the following components:   Color, Urine STRAW (*)    Hgb urine dipstick SMALL (*)    All other components within normal limits  RESP PANEL BY RT-PCR (RSV, FLU A&B, COVID)  [id number]  HEPATITIS PANEL, ACUTE  TROPONIN I (HIGH SENSITIVITY)  TROPONIN I (HIGH SENSITIVITY)    EKG EKG Interpretation Date/Time:  Sunday May 02 2023 08:25:08 EST Ventricular Rate:  70 PR Interval:  276 QRS Duration:  80 QT Interval:  389 QTC Calculation: 426 R Axis:   41  Text Interpretation: Sinus rhythm Prolonged PR interval Borderline T abnormalities, inferior leads Baseline wander in lead(s) II Confirmed by Rolan Bucco [phone]) on 05/02/2023 8:38:28 AM  Radiology CT ABDOMEN PELVIS W CONTRAST Result Date: 05/02/2023 CLINICAL DATA:  Burning abdominal pain associated with nausea and loss of appetite EXAM: CT ABDOMEN AND PELVIS WITH CONTRAST TECHNIQUE: Multidetector CT imaging of the abdomen and pelvis was performed using the standard protocol following bolus administration of intravenous contrast. RADIATION DOSE REDUCTION: This exam was performed according to the departmental dose-optimization program which includes  automated exposure control, adjustment of the mA and/or kV according to patient size and/or use of iterative reconstruction technique. CONTRAST:  OMNIPAQUE IOHEXOL 300 MG/ML  SOLN COMPARISON:  CT abdomen and pelvis dated 09/18/2012 FINDINGS: Lower chest: No focal consolidation or pulmonary nodule in the lung bases. No pleural effusion or pneumothorax demonstrated. Partially imaged heart size is normal. Hepatobiliary: No focal hepatic lesions. No intra or extrahepatic biliary ductal dilation. Normal gallbladder. Pancreas: Pancreas divisum. No focal lesions or main ductal dilation. Spleen: Normal in size without focal abnormality. Adrenals/Urinary Tract: No adrenal nodules. No suspicious renal mass, calculi or hydronephrosis. No focal bladder wall thickening. Stomach/Bowel: Normal appearance of the stomach. No  evidence of bowel wall thickening, distention, or inflammatory changes. Moderate volume stool throughout the colon. Appendix is not discretely seen. Vascular/Lymphatic: Aortic atherosclerosis. No enlarged abdominal or pelvic lymph nodes. Reproductive: Prostate is unremarkable. Other: No free fluid, fluid collection, or free air. Musculoskeletal: No acute or abnormal lytic or blastic osseous lesions. IMPRESSION: 1. No acute abdominopelvic findings. 2. Moderate volume stool throughout the colon. 3.  Aortic Atherosclerosis (ICD10-I70.0). Electronically Signed   By: Agustin Cree M.D.   On: 05/02/2023 12:25   DG Chest Port 1 View Result Date: 05/02/2023 CLINICAL DATA:  Shortness of breath. EXAM: PORTABLE CHEST 1 VIEW COMPARISON:  07/24/2016 FINDINGS: The lungs are clear without focal pneumonia, edema, pneumothorax or pleural effusion. The cardiopericardial silhouette is within normal limits for size. No acute bony abnormality. Telemetry leads overlie the chest. IMPRESSION: No active disease. Electronically Signed   By: Kennith Center M.D.   On: 05/02/2023 08:24    Procedures Procedures    Medications  Ordered in ED Medications  albuterol (VENTOLIN HFA) 108 (90 Base) MCG/ACT inhaler 2 puff (has no administration in time range)  lactated ringers bolus 1,000 mL (0 mLs Intravenous Stopped 05/02/23 1336)  ondansetron (ZOFRAN) injection 4 mg (4 mg Intravenous Given 05/02/23 0858)  albuterol (PROVENTIL) (2.5 MG/3ML) 0.083% nebulizer solution 5 mg (5 mg Nebulization Given 05/02/23 0858)  pantoprazole (PROTONIX) injection 40 mg (40 mg Intravenous Given 05/02/23 0858)  iohexol (OMNIPAQUE) 300 MG/ML solution 100 mL (100 mLs Intravenous Contrast Given 05/02/23 1146)    ED Course/ Medical Decision Making/ A&P                                 Medical Decision Making Amount and/or Complexity of Data Reviewed Labs: ordered. Radiology: ordered.  Risk Prescription drug management.   Patient is a 63 year old male who presents with various symptoms.  He has had some cough and general weakness.  He has some vague burning symptoms in his abdomen.  He has had some nausea and decreased appetite.  Does not have a fever.  No history of alcohol or drug use other than marijuana.  He markedly improved with some IV fluids and Zofran as well as Protonix in the ED.  His symptoms have resolved.  He is able to ambulate without difficulty.  No dizziness.  His labs show mild elevation in his LFTs but otherwise nonconcerning.  He does say that he takes a lot of BC powders.  He does not take more than 3 a day however.  He does not take any other Tylenol.  His respiratory viral panel is negative.  Chest x-ray does not show any evidence of pneumonia.  This was interpreted by me and confirmed by the radiologist.  He had a CT scan which does not show any acute abnormality.  No evidence of pancreatitis.  No evidence of appendicitis or bowel obstruction.  Suspect his symptoms are related to some gastritis.  I do not feel like he is ingested enough Tylenol for toxicity.  He says he does not take it more than 3 times a day and no more than 2  tablets at a time.  However given his mildly elevated LFTs, advised him to stay away from any Tylenol containing products.  Will order hepatitis panel.  Will refer him to follow-up with GI.  Will start him on Protonix and Carafate.  He was discharged home in good condition.  Return precautions were given.  Final Clinical Impression(s) / ED Diagnoses Final diagnoses:  Acute gastritis without hemorrhage, unspecified gastritis type  Elevated liver enzymes    Rx / DC Orders ED Discharge Orders          Ordered    sucralfate (CARAFATE) 1 g tablet  3 times daily with meals & bedtime        05/02/23 1458    pantoprazole (PROTONIX) 20 MG tablet  Daily        05/02/23 1458              Rolan Bucco, MD 05/02/23 1501

## 2023-05-02 NOTE — ED Triage Notes (Signed)
 Patient to ED by POV with c/o SOB. Patient states he has had increased SOB when laying at night x1 week. He reports burning in ABD area, nausea, weakness, appetite loss and coughing up blood. He denies thinners.

## 2023-05-02 NOTE — Discharge Instructions (Addendum)
 Avoid taking BC powders and acetaminophen.  They can appointment to follow-up with a gastroenterologist.  Return to the emergency room if you have any worsening symptoms.

## 2023-05-07 NOTE — ED Notes (Signed)
 05/07/23 1115 pt returned requesting copy of discharge instructions and a work note till Monday. 2 day work note offered and instructions reprinted.

## 2024-01-11 ENCOUNTER — Ambulatory Visit (INDEPENDENT_AMBULATORY_CARE_PROVIDER_SITE_OTHER): Payer: Self-pay | Admitting: Family

## 2024-01-11 ENCOUNTER — Encounter: Payer: Self-pay | Admitting: Family

## 2024-01-11 VITALS — BP 130/74 | Temp 98.0°F | Resp 20 | Ht 67.0 in | Wt 127.8 lb

## 2024-01-11 DIAGNOSIS — G8929 Other chronic pain: Secondary | ICD-10-CM

## 2024-01-11 DIAGNOSIS — K219 Gastro-esophageal reflux disease without esophagitis: Secondary | ICD-10-CM

## 2024-01-11 DIAGNOSIS — M25512 Pain in left shoulder: Secondary | ICD-10-CM | POA: Diagnosis not present

## 2024-01-11 DIAGNOSIS — F1290 Cannabis use, unspecified, uncomplicated: Secondary | ICD-10-CM | POA: Diagnosis not present

## 2024-01-11 DIAGNOSIS — F419 Anxiety disorder, unspecified: Secondary | ICD-10-CM | POA: Diagnosis not present

## 2024-01-11 DIAGNOSIS — G43109 Migraine with aura, not intractable, without status migrainosus: Secondary | ICD-10-CM

## 2024-01-11 DIAGNOSIS — R748 Abnormal levels of other serum enzymes: Secondary | ICD-10-CM

## 2024-01-11 DIAGNOSIS — F32A Depression, unspecified: Secondary | ICD-10-CM

## 2024-01-11 DIAGNOSIS — M25531 Pain in right wrist: Secondary | ICD-10-CM | POA: Diagnosis not present

## 2024-01-11 DIAGNOSIS — I10 Essential (primary) hypertension: Secondary | ICD-10-CM | POA: Diagnosis not present

## 2024-01-11 MED ORDER — PANTOPRAZOLE SODIUM 20 MG PO TBEC: 20.0000 mg | DELAYED_RELEASE_TABLET | Freq: Every day | ORAL | 0 refills | Status: DC

## 2024-01-11 MED ORDER — SERTRALINE HCL 25 MG PO TABS: 25.0000 mg | ORAL_TABLET | Freq: Every day | ORAL | 3 refills | Status: DC

## 2024-01-11 MED ORDER — AMLODIPINE BESYLATE 2.5 MG PO TABS: 2.5000 mg | ORAL_TABLET | Freq: Every day | ORAL | 1 refills | Status: DC

## 2024-01-11 NOTE — Patient Instructions (Signed)
-   Please get right wrist and left shoulder X-ray at [facility] imaging at [facility] then will call you with results.
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
## 2024-01-12 LAB — COMPREHENSIVE METABOLIC PANEL WITH GFR
AG Ratio: 1.6 (calc) (ref 1.0–2.5)
ALT: 9 U/L (ref 9–46)
AST: 19 U/L (ref 10–35)
Albumin: 4.4 g/dL (ref 3.6–5.1)
Alkaline phosphatase (APISO): 59 U/L (ref 35–144)
BUN: 7 mg/dL (ref 7–25)
CO2: 29 mmol/L (ref 20–32)
Calcium: 9.3 mg/dL (ref 8.6–10.3)
Chloride: 105 mmol/L (ref 98–110)
Creat: 0.79 mg/dL (ref 0.70–1.35)
Globulin: 2.8 g/dL (ref 1.9–3.7)
Glucose, Bld: 82 mg/dL (ref 65–99)
Potassium: 4 mmol/L (ref 3.5–5.3)
Sodium: 140 mmol/L (ref 135–146)
Total Bilirubin: 0.3 mg/dL (ref 0.2–1.2)
Total Protein: 7.2 g/dL (ref 6.1–8.1)
eGFR: 100 mL/min/1.73m2 (ref >=60)

## 2024-01-12 LAB — CBC WITH DIFFERENTIAL/PLATELET
Absolute Lymphocytes: 3263 {cells}/uL (ref 850–3900)
Absolute Monocytes: 455 {cells}/uL (ref 200–950)
Basophils Absolute: 72 {cells}/uL (ref 0–200)
Basophils Relative: 1.1 %
Eosinophils Absolute: 429 {cells}/uL (ref 15–500)
Eosinophils Relative: 6.6 %
HCT: 38.5 % (ref 38.5–50.0)
Hemoglobin: 12.7 g/dL — ABNORMAL LOW (ref 13.2–17.1)
MCH: 28.5 pg (ref 27.0–33.0)
MCHC: 33 g/dL (ref 32.0–36.0)
MCV: 86.3 fL (ref 80.0–100.0)
MPV: 10.6 fL (ref 7.5–12.5)
Monocytes Relative: 7 %
Neutro Abs: 2282 {cells}/uL (ref 1500–7800)
Neutrophils Relative %: 35.1 %
Platelets: 252 Thousand/uL (ref 140–400)
RBC: 4.46 Million/uL (ref 4.20–5.80)
RDW: 15.5 % — ABNORMAL HIGH (ref 11.0–15.0)
Total Lymphocyte: 50.2 %
WBC: 6.5 Thousand/uL (ref 3.8–10.8)

## 2024-01-12 LAB — LIPID PANEL
Cholesterol: 186 mg/dL (ref <200)
HDL: 60 mg/dL (ref >=40)
LDL Cholesterol (Calc): 107 mg/dL — ABNORMAL HIGH
Non-HDL Cholesterol (Calc): 126 mg/dL (ref <130)
Total CHOL/HDL Ratio: 3.1 (calc) (ref <5.0)
Triglycerides: 98 mg/dL (ref <150)

## 2024-01-12 LAB — TSH: TSH: 0.99 m[IU]/L (ref 0.40–4.50)

## 2024-01-19 DIAGNOSIS — R748 Abnormal levels of other serum enzymes: Secondary | ICD-10-CM | POA: Insufficient documentation

## 2024-01-19 DIAGNOSIS — F1290 Cannabis use, unspecified, uncomplicated: Secondary | ICD-10-CM | POA: Insufficient documentation

## 2024-01-19 DIAGNOSIS — I10 Essential (primary) hypertension: Secondary | ICD-10-CM | POA: Insufficient documentation

## 2024-01-19 DIAGNOSIS — G43109 Migraine with aura, not intractable, without status migrainosus: Secondary | ICD-10-CM | POA: Insufficient documentation

## 2024-01-19 DIAGNOSIS — F32A Depression, unspecified: Secondary | ICD-10-CM | POA: Insufficient documentation

## 2024-01-19 DIAGNOSIS — M25531 Pain in right wrist: Secondary | ICD-10-CM | POA: Insufficient documentation

## 2024-01-19 DIAGNOSIS — G8929 Other chronic pain: Secondary | ICD-10-CM | POA: Insufficient documentation

## 2024-01-19 DIAGNOSIS — K219 Gastro-esophageal reflux disease without esophagitis: Secondary | ICD-10-CM | POA: Insufficient documentation

## 2024-01-27 ENCOUNTER — Ambulatory Visit: Payer: Self-pay | Admitting: Family

## 2024-02-10 ENCOUNTER — Ambulatory Visit: Admitting: Family

## 2024-02-10 ENCOUNTER — Encounter: Payer: Self-pay | Admitting: Family

## 2024-02-10 VITALS — BP 130/70 | HR 91 | Temp 98.4°F | Resp 19 | Ht 67.0 in | Wt 127.6 lb

## 2024-02-10 DIAGNOSIS — K219 Gastro-esophageal reflux disease without esophagitis: Secondary | ICD-10-CM

## 2024-02-10 DIAGNOSIS — E7800 Pure hypercholesterolemia, unspecified: Secondary | ICD-10-CM | POA: Diagnosis not present

## 2024-02-10 DIAGNOSIS — F419 Anxiety disorder, unspecified: Secondary | ICD-10-CM

## 2024-02-10 DIAGNOSIS — F32A Depression, unspecified: Secondary | ICD-10-CM | POA: Diagnosis not present

## 2024-02-10 DIAGNOSIS — I10 Essential (primary) hypertension: Secondary | ICD-10-CM

## 2024-02-10 MED ORDER — SERTRALINE HCL 25 MG PO TABS: 25.0000 mg | ORAL_TABLET | Freq: Every day | ORAL | 3 refills | Status: AC

## 2024-02-10 MED ORDER — PANTOPRAZOLE SODIUM 20 MG PO TBEC: 20.0000 mg | DELAYED_RELEASE_TABLET | Freq: Every day | ORAL | 0 refills | Status: AC

## 2024-02-10 NOTE — Patient Instructions (Addendum)
 - Please get left shoulder and right wrist X-ray at [facility] imaging at [facility] then will call you with results.  Iron Deficiency Anemia, Adult  Iron deficiency anemia is a condition in which the concentration of red blood cells or hemoglobin in the blood is below normal because of too little iron. Hemoglobin is a substance in red blood cells that carries oxygen to the body's tissues. When the concentration of red blood cells or hemoglobin is too low, not enough oxygen reaches these tissues. Iron deficiency anemia is usually long-lasting, and it develops over time. It may or may not cause symptoms. It is a common type of anemia. What are the causes? This condition may be caused by: Not enough iron in the diet. Abnormal absorption in the gut. Blood loss. What increases the risk? You are more likely to develop this condition if you get menstrual periods (menstruate) or are pregnant. What are the signs or symptoms? Symptoms of this condition may include: Pale skin, lips, and nail beds. Weakness, dizziness, and getting tired easily. Shortness of breath when moving or exercising. Cold hands or feet. Mild anemia may not cause any symptoms. How is this diagnosed? This condition is diagnosed based on: Your medical history. A physical exam. Blood tests. How is this treated? This condition is treated by correcting the cause of your iron deficiency. Treatment may involve: Adding iron-rich foods to your diet. Taking iron supplements. If you are pregnant or breastfeeding, you may need to take extra iron because your normal diet usually does not provide the amount of iron that you need. Increasing vitamin C intake. Vitamin C helps your body absorb iron. Your health care provider may recommend that you take iron supplements along with a glass of orange juice or a vitamin C supplement. Medicines to make heavy menstrual flow lighter. Surgery or additional testing procedures to  determine the cause of your anemia. You may need repeat blood tests to determine whether treatment is working. If the treatment does not seem to be working, you may need more tests. Follow these instructions at home: Medicines Take over-the-counter and prescription medicines only as told by your health care provider. This includes iron supplements and vitamins. This is important because too much iron can be harmful. For the best iron absorption, you should take iron supplements when your stomach is empty. If you cannot tolerate them on an empty stomach, you may need to take them with food. Do not drink milk or take antacids at the same time as your iron supplements. Milk and antacids may interfere with how your body absorbs iron. Iron supplements may turn stool (feces) a darker color and it may appear black. If you cannot tolerate taking iron supplements by mouth, talk with your health care provider about taking them through an IV or through an injection into a muscle. Eating and drinking Talk with your health care provider before changing your diet. Your provider may recommend that you eat foods that contain a lot of iron, such as: Liver. Low-fat (lean) beef. Breads and cereals that have iron added to them (are fortified). Eggs. Dried fruit. Dark green, leafy vegetables. To help your body use the iron from iron-rich foods, eat those foods at the same time as fresh fruits and vegetables that are high in vitamin C. Foods that are high in vitamin C include: Oranges. Peppers. Tomatoes. Mangoes. Managing constipation If you are taking an iron supplement, it may cause constipation. To prevent or treat constipation, you  may need to: Drink enough fluid to keep your urine pale yellow. Take over-the-counter or prescription medicines. Eat foods that are high in fiber, such as beans, whole grains, and fresh fruits and vegetables. Limit foods that are high in fat and processed sugars, such as fried or  sweet foods. General instructions Return to your normal activities as told by your health care provider. Ask your health care provider what activities are safe for you. Keep all follow-up visits. Contact a health care provider if: You feel nauseous or you vomit. You feel weak. You become light-headed when getting up from a sitting or lying down position. You have unexplained sweating. You develop symptoms of constipation. You have a heaviness in your chest. You have trouble breathing with physical activity. Get help right away if: You faint. If this happens, do not drive yourself to the hospital. You have an irregular or rapid heartbeat. Summary Iron deficiency anemia is a condition in which the concentration of red blood cells or hemoglobin in the blood is below normal because of too little iron. This condition is treated by correcting the cause of your iron deficiency. Take over-the-counter and prescription medicines only as told by your health care provider. This includes iron supplements and vitamins. To help your body use the iron from iron-rich foods, eat those foods at the same time as fresh fruits and vegetables that are high in vitamin C. Seek medical help if you have signs or symptoms of worsening anemia. This information is not intended to replace advice given to you by your health care provider. Make sure you discuss any questions you have with your health care provider. Document Revised: 04/02/2021 Document Reviewed: 04/02/2021 Elsevier Patient Education  2024 [employer].
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
## 2024-08-10 ENCOUNTER — Ambulatory Visit: Admitting: Family
